# Patient Record
Sex: Male | Born: 1950 | Marital: Married | Smoking: Never smoker
Health system: Southern US, Community
[De-identification: ages and names within clinical notes are randomized; demographics above are authoritative.]

## PROBLEM LIST (undated history)

## (undated) DIAGNOSIS — N529 Male erectile dysfunction, unspecified: Secondary | ICD-10-CM

## (undated) DIAGNOSIS — B009 Herpesviral infection, unspecified: Secondary | ICD-10-CM

## (undated) DIAGNOSIS — F419 Anxiety disorder, unspecified: Secondary | ICD-10-CM

## (undated) DIAGNOSIS — F32A Depression, unspecified: Secondary | ICD-10-CM

## (undated) DIAGNOSIS — M1990 Unspecified osteoarthritis, unspecified site: Secondary | ICD-10-CM

## (undated) DIAGNOSIS — F329 Major depressive disorder, single episode, unspecified: Secondary | ICD-10-CM

## (undated) HISTORY — PX: WISDOM TOOTH EXTRACTION: SHX21

## (undated) HISTORY — PX: COLONOSCOPY: SHX174

## (undated) HISTORY — PX: EYE SURGERY: SHX253

## (undated) HISTORY — PX: KNEE SURGERY: SHX244

---
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
## 2000-10-31 ENCOUNTER — Ambulatory Visit (HOSPITAL_COMMUNITY): Admission: RE | Admit: 2000-10-31 | Discharge: 2000-10-31 | Payer: Self-pay | Admitting: Gastroenterology

## 2000-10-31 ENCOUNTER — Encounter (INDEPENDENT_AMBULATORY_CARE_PROVIDER_SITE_OTHER): Payer: Self-pay | Admitting: *Deleted

## 2001-11-20 ENCOUNTER — Observation Stay (HOSPITAL_COMMUNITY): Admission: EM | Admit: 2001-11-20 | Discharge: 2001-11-21 | Payer: Self-pay | Admitting: *Deleted

## 2002-02-19 ENCOUNTER — Encounter: Admission: RE | Admit: 2002-02-19 | Discharge: 2002-02-19 | Payer: Self-pay | Admitting: Family Medicine

## 2002-02-19 ENCOUNTER — Encounter: Payer: Self-pay | Admitting: Family Medicine

## 2003-05-17 ENCOUNTER — Emergency Department (HOSPITAL_COMMUNITY): Admission: EM | Admit: 2003-05-17 | Discharge: 2003-05-17 | Payer: Self-pay | Admitting: Emergency Medicine

## 2003-09-06 ENCOUNTER — Inpatient Hospital Stay (HOSPITAL_BASED_OUTPATIENT_CLINIC_OR_DEPARTMENT_OTHER): Admission: RE | Admit: 2003-09-06 | Discharge: 2003-09-06 | Payer: Self-pay | Admitting: *Deleted

## 2004-01-13 ENCOUNTER — Ambulatory Visit: Payer: Self-pay | Admitting: Cardiology

## 2004-05-03 IMAGING — CR DG CHEST 1V PORT
1 series · 1 of 1 positions shown · non-contrast
Comparison: none

CLINICAL DATA: Chest pain.
 PORTABLE CHEST, SINGLE VIEW 
 An AP semierect portable film of the chest dated 05/17/03 at 3328 hours is compared to a previous study of 11/20/01 at 6979 hours and shows little significant interval change.  The heart is within the limits of normal.  There mild generalized peribronchial thickening but no evidence of active infiltrate, edema, consolidation, pleural effusion or pneumothorax.  The bones are normal.

[view not recorded]
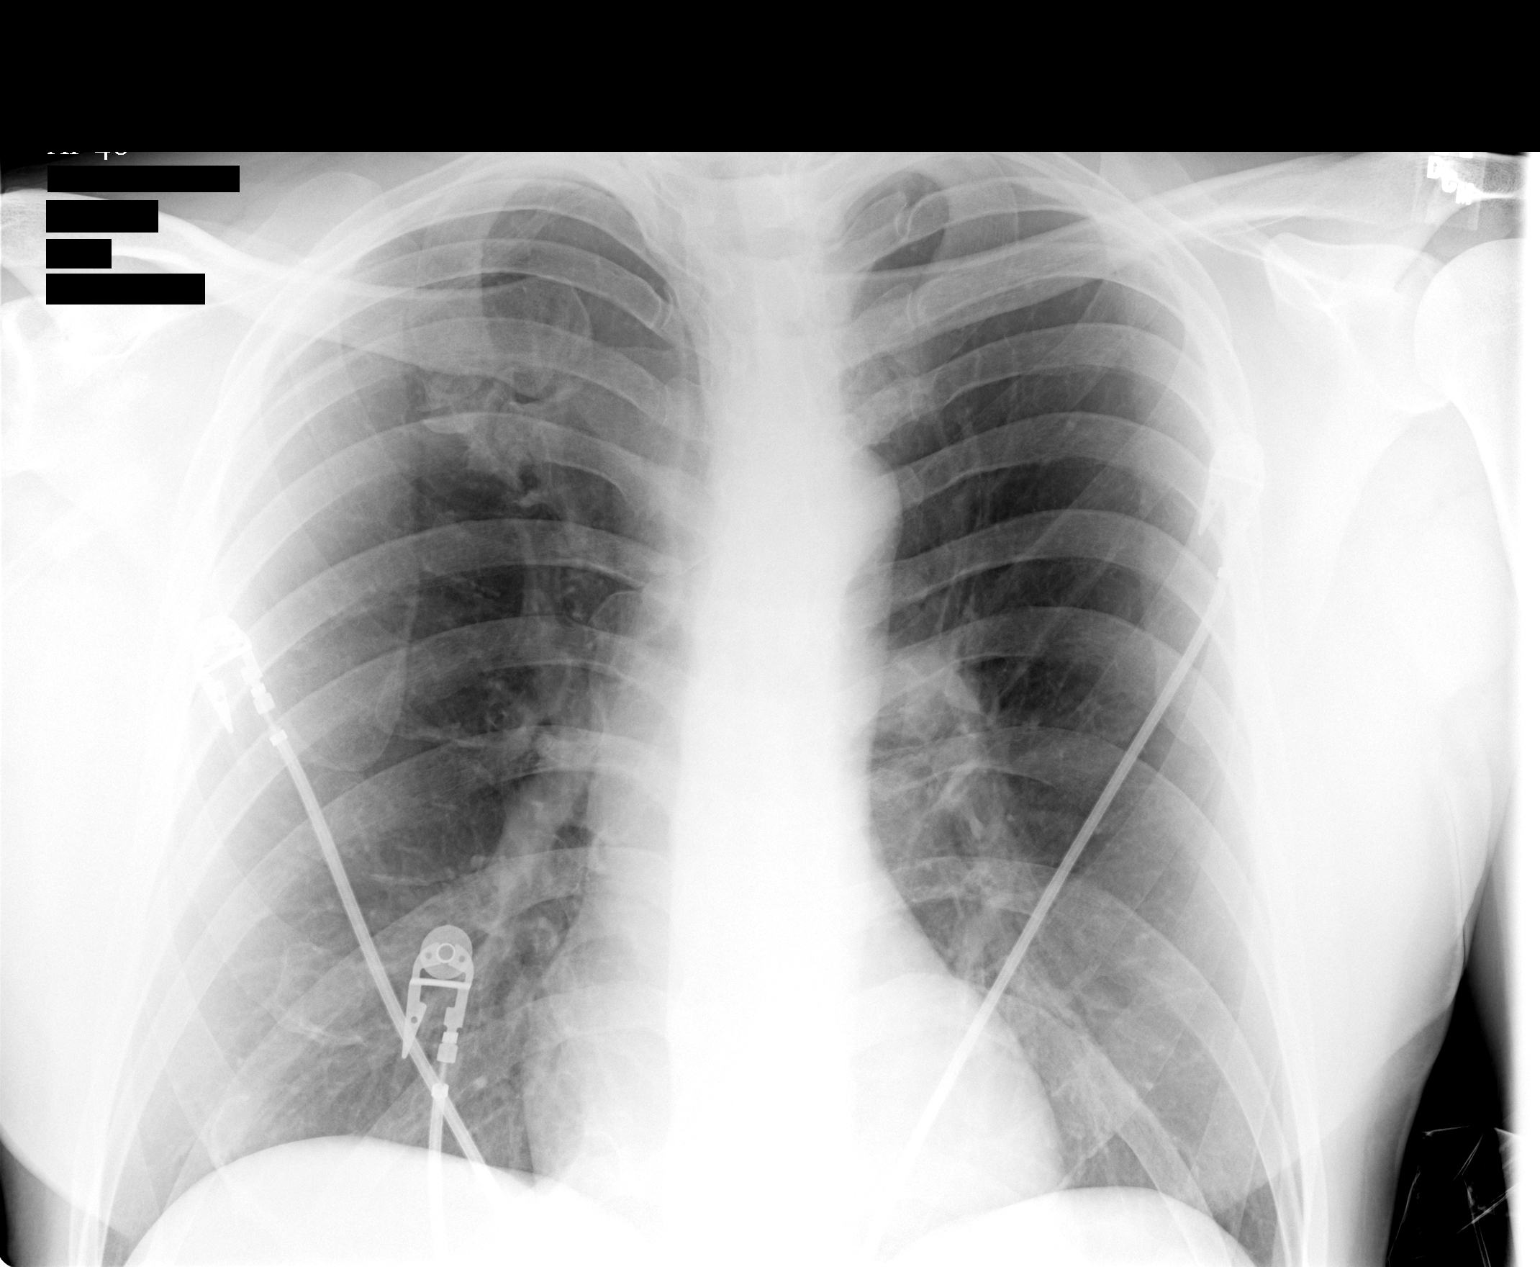

[1 of 1 positions shown; findings below may reference images not displayed]

IMPRESSION: No evidence of active disease in the chest.

## 2010-06-15 ENCOUNTER — Other Ambulatory Visit: Payer: Self-pay | Admitting: Gastroenterology

## 2010-08-03 NOTE — Procedures (Signed)
. [facility]  Patient:    Greg Munoz, Greg Munoz                         MRN: [id number] Proc. Date: 10/31/00 Adm. Date:  [id number] Attending:  Rich Brave CC:         Carolyne Fiscal, M.D.   Procedure Report  PROCEDURE:  Colonoscopy with polypectomy.  ENDOSCOPIST:  Florencia Reasons, M.D.  INDICATIONS:  A 60 year old with family history of colon cancer for screening. His most recent colonoscopy five years ago was negative except for two hyperplastic polyps.  FINDINGS:  Normal exam to the cecum except for two small polyps removed.  DESCRIPTION OF PROCEDURE:  The nature, purpose, and risks of the procedure were familiar to the patient from prior examination, and he provided written consent.  Sedation was fentanyl 100 mcg and Versed 10 mg IV without arrhythmias or desaturation.  Digital exam of the prostate was unremarkable.  The Olympus adult video colonoscope was advanced with just moderate difficulty through the sigmoid area, overcome by having the patient in the supine position.  Advancement was then easy to the cecum identified by visualization of the appendiceal orifice, and pullback was performed in the gradual fashion.  The quality of he prep was excellent, and it is felt that all areas were well seen.  There was a 5 mm sessile polyp just above the cecum removed by snare technique and suctioned through the scope.  There was a 3 mm sessile polyp at 60 cm removed by a single hot biopsy.  In each case, there was complete hemostasis and no evidence of excessive cautery.  The remainder of the colon was normal. No large polyps, cancer, colitis, vascular malformations, or diverticulosis were noted, and retroflexion in the rectum was unremarkable.  The patient tolerated well, and there were no apparent complication.  IMPRESSION:   Two colon polyps removed as described above.  PLAN:  Await pathology on the biopsies with further  colonoscopic surveillance in the future, the interval depending on the histologic findings. DD:  10/31/00 TD:  11/01/00 Job: [id number]

## 2010-08-03 NOTE — Cardiovascular Report (Signed)
NAME:  ASIM, GERSTEN NO.:  [id number]   MEDICAL RECORD NO.:  [id number]                   PATIENT TYPE:  OIB   LOCATION:  6501                                 FACILITY:  [facility]   PHYSICIAN:  Carole Binning, M.D. [facility]         DATE OF BIRTH:  December 20, 1950   DATE OF PROCEDURE:  09/06/2003  DATE OF DISCHARGE:  09/06/2003                              CARDIAC CATHETERIZATION   PROCEDURE PERFORMED:  Left heart catheterization with coronary angiography  and left ventriculography.   INDICATION:  Mr. Fludd is a 60 year old male who presented with symptoms of  chest pain.  A stress Cardiolite scan showed possible inferior ischemia.  He  was therefore referred for cardiac catheterization to rule out coronary  artery disease.   CATHETERIZATION PROCEDURAL NOTE:  A 4 French sheath was placed in the right  femoral artery.  Coronary angiography was performed with standard Judkins 4  French catheters.  Left ventriculography was performed with an angled  pigtail catheter.  Contrast was Omnipaque.  There were no complications.   CATHETERIZATION RESULTS:   HEMODYNAMICS:  1. Left ventricular pressure 128/20.  2. Aortic pressure 115/78.  3. There is no aortic valve gradient.   LEFT VENTRICULOGRAM:  Wall motion is normal.  Ejection fraction estimated at  greater than or equal to 60%.  There is no mitral regurgitation.   CORONARY ARTERIOGRAPHY (RIGHT DOMINANT):  Left main is normal.   Left anterior descending artery gives rise to a very large optional diagonal  branch which supplies the anterolateral wall.  There is a normal size second  diagonal branch also arising from the LAD.  The LAD is angiographically  normal.   Left circumflex gives rise to two normal to large size obtuse marginal  branches.  The left circumflex is normal.   Right coronary artery gives rise to a normal size posterior descending  artery and a small posterior lateral branch.  The right  coronary artery is  normal.   IMPRESSION:  1. Normal left ventricular systolic function.  2. Normal coronary arteries by angiography.   In summary, the patient's chest pain appears to be noncardiac.                                               Carole Binning, M.D. [facility]    MWP/MEDQ  D:  09/06/2003  T:  09/07/2003  Job:  [id number]   cc:   Mosetta Putt, M.D.  [address] [place]  [place] [address]  Fax: [phone]   Olga Millers, M.D. [facility]

## 2011-03-15 DIAGNOSIS — B009 Herpesviral infection, unspecified: Secondary | ICD-10-CM | POA: Insufficient documentation

## 2011-08-16 DIAGNOSIS — Z6827 Body mass index (BMI) 27.0-27.9, adult: Secondary | ICD-10-CM | POA: Insufficient documentation

## 2011-08-23 DIAGNOSIS — R030 Elevated blood-pressure reading, without diagnosis of hypertension: Secondary | ICD-10-CM | POA: Insufficient documentation

## 2012-11-23 DIAGNOSIS — G4700 Insomnia, unspecified: Secondary | ICD-10-CM | POA: Insufficient documentation

## 2013-11-24 DIAGNOSIS — Z0000 Encounter for general adult medical examination without abnormal findings: Secondary | ICD-10-CM | POA: Insufficient documentation

## 2015-10-20 DIAGNOSIS — G4730 Sleep apnea, unspecified: Secondary | ICD-10-CM | POA: Insufficient documentation

## 2016-01-31 ENCOUNTER — Other Ambulatory Visit (INDEPENDENT_AMBULATORY_CARE_PROVIDER_SITE_OTHER): Payer: Self-pay | Admitting: Orthopedic Surgery

## 2016-01-31 ENCOUNTER — Telehealth (INDEPENDENT_AMBULATORY_CARE_PROVIDER_SITE_OTHER): Payer: Self-pay

## 2016-01-31 MED ORDER — HYDROCODONE-ACETAMINOPHEN 5-325 MG PO TABS: ORAL_TABLET | ORAL | 0 refills | Status: DC

## 2016-01-31 NOTE — Telephone Encounter (Signed)
Patient called and states he needs a RF on hydrocodone. Please call back at [phone]

## 2016-01-31 NOTE — Telephone Encounter (Signed)
IC pt and LMVM that Rx ready at front deak for pickup

## 2016-01-31 NOTE — Telephone Encounter (Signed)
Please advise 

## 2016-01-31 NOTE — Telephone Encounter (Signed)
Ok to rf pls cla lthx

## 2016-02-29 ENCOUNTER — Telehealth (INDEPENDENT_AMBULATORY_CARE_PROVIDER_SITE_OTHER): Payer: Self-pay | Admitting: Orthopedic Surgery

## 2016-02-29 NOTE — Telephone Encounter (Signed)
Rx request hydrocodone, please advise

## 2016-03-01 ENCOUNTER — Telehealth (INDEPENDENT_AMBULATORY_CARE_PROVIDER_SITE_OTHER): Payer: Self-pay | Admitting: Orthopedic Surgery

## 2016-03-01 MED ORDER — HYDROCODONE-ACETAMINOPHEN 5-325 MG PO TABS: ORAL_TABLET | ORAL | 0 refills | Status: DC

## 2016-03-01 NOTE — Telephone Encounter (Signed)
IC pt and LMVM Rx ready to pickup at desk

## 2016-03-01 NOTE — Telephone Encounter (Signed)
Patient called asked concerning his Rx for (hydrocodone) The number to contact him is [phone]

## 2016-03-01 NOTE — Addendum Note (Signed)
Addended byCherre Huger: Brittania Sudbeck on: 03/01/2016 11:23 AM   Modules accepted: Orders

## 2016-03-01 NOTE — Telephone Encounter (Signed)
Ok to rf pls cla lthx

## 2016-03-01 NOTE — Telephone Encounter (Signed)
IC pt and LMVM that Rx is ready to pickup at the front desk

## 2016-04-05 ENCOUNTER — Other Ambulatory Visit (INDEPENDENT_AMBULATORY_CARE_PROVIDER_SITE_OTHER): Payer: Self-pay | Admitting: *Deleted

## 2016-04-05 MED ORDER — HYDROCODONE-ACETAMINOPHEN 5-325 MG PO TABS: ORAL_TABLET | ORAL | 0 refills | Status: DC

## 2016-04-05 NOTE — Telephone Encounter (Signed)
Pt called for refill hydrocodone

## 2016-04-05 NOTE — Telephone Encounter (Signed)
Can you call pt to advise Rx ready?  thanks

## 2016-04-05 NOTE — Telephone Encounter (Signed)
Rx request 

## 2016-04-05 NOTE — Telephone Encounter (Signed)
Rx approved per Dr August Saucerean, will call pt to pickup once signed.

## 2016-04-05 NOTE — Telephone Encounter (Signed)
I called patient to let him know that Rx is ready to pick up.  Office is closing at 5pm, but will reopen tomorrow from 9-3.

## 2016-04-17 ENCOUNTER — Encounter (INDEPENDENT_AMBULATORY_CARE_PROVIDER_SITE_OTHER): Payer: Self-pay | Admitting: Orthopedic Surgery

## 2016-04-17 ENCOUNTER — Ambulatory Visit (INDEPENDENT_AMBULATORY_CARE_PROVIDER_SITE_OTHER): Payer: BC Managed Care – PPO | Admitting: Orthopedic Surgery

## 2016-04-17 ENCOUNTER — Ambulatory Visit (INDEPENDENT_AMBULATORY_CARE_PROVIDER_SITE_OTHER): Payer: Self-pay

## 2016-04-17 DIAGNOSIS — M19011 Primary osteoarthritis, right shoulder: Secondary | ICD-10-CM

## 2016-04-17 MED ORDER — METHYLPREDNISOLONE ACETATE 40 MG/ML IJ SUSP
40.0000 mg | INTRAMUSCULAR | Status: AC | PRN
  Administered 2016-04-17: 40 mg via INTRA_ARTICULAR

## 2016-04-17 MED ORDER — LIDOCAINE HCL 1 % IJ SOLN
5.0000 mL | INTRAMUSCULAR | Status: AC | PRN
  Administered 2016-04-17: 5 mL

## 2016-04-17 MED ORDER — BUPIVACAINE HCL 0.5 % IJ SOLN
9.0000 mL | INTRAMUSCULAR | Status: AC | PRN
  Administered 2016-04-17: 9 mL via INTRA_ARTICULAR

## 2016-04-17 NOTE — Progress Notes (Signed)
   Procedure Note  Patient: Greg Munoz             Date of Birth: 09/28/1950           MRN: [id number]             Visit Date: 04/17/2016  Procedures: Visit Diagnoses: Primary osteoarthritis of right shoulder  Large Joint Inj Date/Time: 04/17/2016 1:02 PM Performed by: Cherre HugerMAY, WENDY Authorized by: Cammy CopaEAN, [name]   Consent Given by:  Patient Site marked: the procedure site was marked   Timeout: prior to procedure the correct patient, procedure, and site was verified   Indications:  Pain and diagnostic evaluation Location:  Shoulder Site:  R subacromial bursa Prep: patient was prepped and draped in usual sterile fashion   Needle Size:  18 G Needle Length:  1.5 inches Approach:  Posterior Ultrasound Guidance: No   Fluoroscopic Guidance: No   Arthrogram: No   Medications:  9 mL bupivacaine 0.5 %; 5 mL lidocaine 1 %; 40 mg methylPREDNISolone acetate 40 MG/ML Aspiration Attempted: No   Patient tolerance:  Patient tolerated the procedure well with no immediate complications

## 2016-05-07 ENCOUNTER — Telehealth (INDEPENDENT_AMBULATORY_CARE_PROVIDER_SITE_OTHER): Payer: Self-pay | Admitting: Orthopedic Surgery

## 2016-05-07 NOTE — Telephone Encounter (Signed)
Patient called needing Rx refilled (hydrocodone) The number to contact patient is [phone]

## 2016-05-07 NOTE — Telephone Encounter (Signed)
Rx request 

## 2016-05-08 MED ORDER — HYDROCODONE-ACETAMINOPHEN 5-325 MG PO TABS: ORAL_TABLET | ORAL | 0 refills | Status: DC

## 2016-05-08 NOTE — Addendum Note (Signed)
Addended byCherre Huger: Barnard Sharps on: 05/08/2016 05:44 PM   Modules accepted: Orders

## 2016-05-08 NOTE — Telephone Encounter (Signed)
IC patient and advised Rx ready for pickup. LMVM

## 2016-05-08 NOTE — Telephone Encounter (Signed)
Ok to rf pls cla lthx

## 2016-06-06 ENCOUNTER — Telehealth (INDEPENDENT_AMBULATORY_CARE_PROVIDER_SITE_OTHER): Payer: Self-pay | Admitting: Orthopedic Surgery

## 2016-06-06 NOTE — Telephone Encounter (Signed)
Patient called needing a refill on hydrocodone. CB # [phone]

## 2016-06-06 NOTE — Telephone Encounter (Signed)
Ok to refill 

## 2016-06-07 MED ORDER — HYDROCODONE-ACETAMINOPHEN 5-325 MG PO TABS
ORAL_TABLET | ORAL | 0 refills | Status: DC
Start: 2016-06-07 — End: 2016-07-08

## 2016-06-07 NOTE — Addendum Note (Signed)
Addended byPrescott Parma: Shivan Hodes on: 06/07/2016 12:08 PM   Modules accepted: Orders

## 2016-06-07 NOTE — Telephone Encounter (Signed)
rx can be picked up at front desk.

## 2016-06-07 NOTE — Telephone Encounter (Signed)
Ok refill? 

## 2016-06-07 NOTE — Telephone Encounter (Signed)
Advised patient ready for pick up  

## 2016-07-04 ENCOUNTER — Telehealth (INDEPENDENT_AMBULATORY_CARE_PROVIDER_SITE_OTHER): Payer: Self-pay | Admitting: *Deleted

## 2016-07-04 NOTE — Telephone Encounter (Signed)
Ok to rf? 

## 2016-07-04 NOTE — Telephone Encounter (Signed)
Patient called in this morning in regards to needing a prescription refill on his Hydrocodone please. His CB # [phone]. Thank you

## 2016-07-05 NOTE — Telephone Encounter (Signed)
Okay to refill but make it for 6 weeks instead of of 4 weeks thanks

## 2016-07-08 MED ORDER — HYDROCODONE-ACETAMINOPHEN 5-325 MG PO TABS: ORAL_TABLET | ORAL | 0 refills | Status: DC

## 2016-07-08 NOTE — Telephone Encounter (Signed)
rx done. Called patient and LM advising could pick up rx after 2pm this afternoon.

## 2016-09-05 ENCOUNTER — Telehealth (INDEPENDENT_AMBULATORY_CARE_PROVIDER_SITE_OTHER): Payer: Self-pay | Admitting: *Deleted

## 2016-09-05 NOTE — Telephone Encounter (Signed)
Pt calling for hydrocodone refill 

## 2016-09-05 NOTE — Telephone Encounter (Signed)
Ok to rf? 

## 2016-09-06 MED ORDER — HYDROCODONE-ACETAMINOPHEN 5-325 MG PO TABS: ORAL_TABLET | ORAL | 0 refills | Status: DC

## 2016-09-06 NOTE — Telephone Encounter (Signed)
Called and LM for patient advising could pick up after 2pm on Monday

## 2016-09-06 NOTE — Telephone Encounter (Signed)
y

## 2016-11-08 ENCOUNTER — Telehealth (INDEPENDENT_AMBULATORY_CARE_PROVIDER_SITE_OTHER): Payer: Self-pay | Admitting: Orthopedic Surgery

## 2016-11-08 NOTE — Telephone Encounter (Signed)
Patient called needing Rx refilled (Hydrocodone) The number to contact patient is [phone]

## 2016-11-11 NOTE — Telephone Encounter (Signed)
Please advise if ok to rf? 

## 2016-11-13 MED ORDER — HYDROCODONE-ACETAMINOPHEN 5-325 MG PO TABS
ORAL_TABLET | ORAL | 0 refills | Status: DC
Start: 2016-11-13 — End: 2017-01-10

## 2016-11-13 NOTE — Telephone Encounter (Signed)
When was last rf

## 2016-11-13 NOTE — Telephone Encounter (Signed)
Ok to rf pls cla lthx

## 2016-11-13 NOTE — Telephone Encounter (Signed)
0621

## 2016-11-13 NOTE — Addendum Note (Signed)
Addended byPrescott Parma: Diane Mochizuki on: 11/13/2016 12:36 PM   Modules accepted: Orders

## 2016-11-13 NOTE — Telephone Encounter (Signed)
Called advised could pick up at front desk. 

## 2017-01-09 ENCOUNTER — Telehealth (INDEPENDENT_AMBULATORY_CARE_PROVIDER_SITE_OTHER): Payer: Self-pay | Admitting: Orthopedic Surgery

## 2017-01-09 NOTE — Telephone Encounter (Signed)
Patient called asking for a refill on hydrocodone. CB # [phone]

## 2017-01-09 NOTE — Telephone Encounter (Signed)
Refill request, pls advise 

## 2017-01-10 ENCOUNTER — Other Ambulatory Visit (INDEPENDENT_AMBULATORY_CARE_PROVIDER_SITE_OTHER): Payer: Self-pay

## 2017-01-10 MED ORDER — HYDROCODONE-ACETAMINOPHEN 5-325 MG PO TABS: ORAL_TABLET | ORAL | 0 refills | Status: DC

## 2017-01-10 NOTE — Telephone Encounter (Signed)
Okay for 60 tablets 1 p.o. every 48 hours for 4 months please call thanks

## 2017-01-10 NOTE — Telephone Encounter (Signed)
IC LM could pick up at front desk.

## 2017-01-10 NOTE — Telephone Encounter (Signed)
Can you finish this one?  Get him to sign? thanks

## 2017-05-15 ENCOUNTER — Telehealth (INDEPENDENT_AMBULATORY_CARE_PROVIDER_SITE_OTHER): Payer: Self-pay | Admitting: Orthopedic Surgery

## 2017-05-15 MED ORDER — HYDROCODONE-ACETAMINOPHEN 5-325 MG PO TABS: ORAL_TABLET | ORAL | 0 refills | Status: DC

## 2017-05-15 NOTE — Telephone Encounter (Signed)
Ok to rf x 1 for 4 more mos then will need rov pls cla lhtx

## 2017-05-15 NOTE — Telephone Encounter (Signed)
Patient requesting RX refill on Hydrocodone. Please advise # [phone]

## 2017-05-15 NOTE — Telephone Encounter (Signed)
Ok to refill? Last filled 01/09/17 to last 4 months

## 2017-05-15 NOTE — Telephone Encounter (Signed)
IC advised could pick up at front desk. Dr August Saucerean changed rx to 1 po every other day must last 2 months #30

## 2017-07-15 ENCOUNTER — Telehealth (INDEPENDENT_AMBULATORY_CARE_PROVIDER_SITE_OTHER): Payer: Self-pay | Admitting: Orthopedic Surgery

## 2017-07-15 NOTE — Telephone Encounter (Signed)
Patient called requesting an RX refill on his Hydrocodone.  Patient uses [facility] on the [address] of [place] and [address].  CB#[phone].  Thank you.

## 2017-07-15 NOTE — Telephone Encounter (Signed)
Please advise if ok to rf? Last seen 03/2016

## 2017-07-15 NOTE — Telephone Encounter (Signed)
y

## 2017-07-16 MED ORDER — HYDROCODONE-ACETAMINOPHEN 5-325 MG PO TABS: ORAL_TABLET | ORAL | 0 refills | Status: DC

## 2017-07-16 NOTE — Telephone Encounter (Signed)
IC advised could pick up at front desk.  

## 2017-08-25 ENCOUNTER — Telehealth (INDEPENDENT_AMBULATORY_CARE_PROVIDER_SITE_OTHER): Payer: Self-pay | Admitting: Orthopedic Surgery

## 2017-08-25 NOTE — Telephone Encounter (Signed)
Patient called wanting to schedule an appointment to get an injection in both knees. CB # [phone]

## 2017-08-25 NOTE — Telephone Encounter (Signed)
IC s/w patient appt scheduled.  

## 2017-09-01 ENCOUNTER — Telehealth (INDEPENDENT_AMBULATORY_CARE_PROVIDER_SITE_OTHER): Payer: Self-pay

## 2017-09-01 ENCOUNTER — Encounter (INDEPENDENT_AMBULATORY_CARE_PROVIDER_SITE_OTHER): Payer: Self-pay | Admitting: Orthopedic Surgery

## 2017-09-01 ENCOUNTER — Ambulatory Visit (INDEPENDENT_AMBULATORY_CARE_PROVIDER_SITE_OTHER): Payer: BC Managed Care – PPO

## 2017-09-01 ENCOUNTER — Ambulatory Visit (INDEPENDENT_AMBULATORY_CARE_PROVIDER_SITE_OTHER): Payer: BC Managed Care – PPO | Admitting: Orthopedic Surgery

## 2017-09-01 DIAGNOSIS — M25562 Pain in left knee: Secondary | ICD-10-CM | POA: Diagnosis not present

## 2017-09-01 DIAGNOSIS — M1711 Unilateral primary osteoarthritis, right knee: Secondary | ICD-10-CM | POA: Diagnosis not present

## 2017-09-01 DIAGNOSIS — M25561 Pain in right knee: Secondary | ICD-10-CM

## 2017-09-01 DIAGNOSIS — M1712 Unilateral primary osteoarthritis, left knee: Secondary | ICD-10-CM

## 2017-09-01 NOTE — Telephone Encounter (Signed)
Noted  

## 2017-09-01 NOTE — Telephone Encounter (Signed)
Can we please get patient pre-approved for bilateral knee gel injections? Thanks.

## 2017-09-02 ENCOUNTER — Telehealth (INDEPENDENT_AMBULATORY_CARE_PROVIDER_SITE_OTHER): Payer: Self-pay

## 2017-09-02 NOTE — Telephone Encounter (Signed)
Submitted application online for SynviscOne, bilateral knee. 

## 2017-09-06 ENCOUNTER — Encounter (INDEPENDENT_AMBULATORY_CARE_PROVIDER_SITE_OTHER): Payer: Self-pay | Admitting: Orthopedic Surgery

## 2017-09-06 MED ORDER — BUPIVACAINE HCL 0.25 % IJ SOLN
4.0000 mL | INTRAMUSCULAR | Status: AC | PRN
  Administered 2017-09-06: 4 mL via INTRA_ARTICULAR

## 2017-09-06 MED ORDER — METHYLPREDNISOLONE ACETATE 40 MG/ML IJ SUSP
40.0000 mg | INTRAMUSCULAR | Status: AC | PRN
  Administered 2017-09-06: 40 mg via INTRA_ARTICULAR

## 2017-09-06 MED ORDER — LIDOCAINE HCL 1 % IJ SOLN
5.0000 mL | INTRAMUSCULAR | Status: AC | PRN
  Administered 2017-09-06: 5 mL

## 2017-09-06 MED ORDER — LIDOCAINE HCL 1 % IJ SOLN
5.0000 mL | INTRAMUSCULAR | Status: AC | PRN
Start: 2017-09-06 — End: 2017-09-06
  Administered 2017-09-06: 5 mL

## 2017-09-06 NOTE — Progress Notes (Signed)
Office Visit Note   Patient: Greg Munoz           Date of Birth: 04/24/1950           MRN: [id number] Visit Date: 09/01/2017 Requested by: Tracey HarriesBouska, David, MD [address] [place], [place] [address] PCP: Tracey HarriesBouska, David, MD  Subjective: Chief Complaint  Patient presents with  . Knee Pain    bilateral    HPI: Greg MaduroRobert is a patient with bilateral knee pain and arthritis.  He reports left greater than right knee pain along with swelling.  He describes constant pain in the knee.  He describes giving way occasionally.  He also describes pain at rest when he sitting in the car.  He has known history of shoulder arthritis.  He takes occasional Aleve and occasional hydrocodone.              ROS: All systems reviewed are negative as they relate to the chief complaint within the history of present illness.  Patient denies  fevers or chills.   Assessment & Plan: Visit Diagnoses:  1. Pain in both knees, unspecified chronicity   2. Unilateral primary osteoarthritis, left knee   3. Unilateral primary osteoarthritis, right knee     Plan: Impression is severe end-stage arthritis worse in the medial compartment both knees with significant varus alignment.  Plan is cortisone injections today.  We will preapproved him for a gel injection.  I will see him back as needed.  We can do the gel injection once his cortisone injections wear off  Follow-Up Instructions: Return if symptoms worsen or fail to improve.   Orders:  Orders Placed This Encounter  Procedures  . XR KNEE 3 VIEW RIGHT  . XR Knee 1-2 Views Left   No orders of the defined types were placed in this encounter.     Procedures: Large Joint Inj: bilateral knee on 09/06/2017 10:04 AM Indications: diagnostic evaluation, joint swelling and pain Details: 18 G 1.5 in needle, superolateral approach  Arthrogram: No  Medications (Right): 5 mL lidocaine 1 %; 4 mL bupivacaine 0.25 %; 40 mg methylPREDNISolone acetate 40  MG/ML Medications (Left): 5 mL lidocaine 1 %; 4 mL bupivacaine 0.25 %; 40 mg methylPREDNISolone acetate 40 MG/ML Outcome: tolerated well, no immediate complications Procedure, treatment alternatives, risks and benefits explained, specific risks discussed. Consent was given by the patient. Immediately prior to procedure a time out was called to verify the correct patient, procedure, equipment, support staff and site/side marked as required. Patient was prepped and draped in the usual sterile fashion.       Clinical Data: No additional findings.  Objective: Vital Signs: There were no vitals taken for this visit.  Physical Exam:   Constitutional: Patient appears well-developed HEENT:  Head: Normocephalic Eyes:EOM are normal Neck: Normal range of motion Cardiovascular: Normal rate Pulmonary/chest: Effort normal Neurologic: Patient is alert Skin: Skin is warm Psychiatric: Patient has normal mood and affect    Ortho Exam: Ortho exam demonstrates varus alignment bilateral lower extremities.  Pedal pulses palpable.  No groin pain with internal and external rotation of the leg.  He is developing about 5 degree flexion contractures in both knees.  Extensor mechanism is intact.  Collateral and cruciate ligaments are stable.  Range of motion is easily past 90 degrees.  This is in both knees.  Specialty Comments:  No specialty comments available.  Imaging: No results found.   PMFS History: There are no active problems to display for  this patient.  History reviewed. No pertinent past medical history.  History reviewed. No pertinent family history.  History reviewed. No pertinent surgical history. Social History   Occupational History  . Not on file  Tobacco Use  . Smoking status: Never Smoker  . Smokeless tobacco: Never Used  Substance and Sexual Activity  . Alcohol use: Not on file  . Drug use: Not on file  . Sexual activity: Not on file

## 2017-09-15 ENCOUNTER — Telehealth (INDEPENDENT_AMBULATORY_CARE_PROVIDER_SITE_OTHER): Payer: Self-pay

## 2017-09-15 ENCOUNTER — Telehealth (INDEPENDENT_AMBULATORY_CARE_PROVIDER_SITE_OTHER): Payer: Self-pay | Admitting: Orthopedic Surgery

## 2017-09-15 MED ORDER — HYDROCODONE-ACETAMINOPHEN 5-325 MG PO TABS: ORAL_TABLET | ORAL | 0 refills | Status: DC

## 2017-09-15 NOTE — Telephone Encounter (Signed)
Ok to refill?  #30 on 07/16/17  To take every other day prn pain must last 2 months

## 2017-09-15 NOTE — Telephone Encounter (Signed)
Talked with patient and advised him that is approvbed for SynviscOne, bilateral knee. Buy & Bill Covered at 100% after $80.00 co-pay  [place] Approved Reference#-[id number] Valid 09/12/2017- 09/12/2018 Appt. 09/24/17

## 2017-09-15 NOTE — Telephone Encounter (Signed)
Patient requesting rx refill on hydrocodone. Patients # [phone]

## 2017-09-15 NOTE — Telephone Encounter (Signed)
y

## 2017-09-15 NOTE — Telephone Encounter (Signed)
PA form faxed for SynviscOne, bilateral knee to [phone] or [phone] per Toniann FailWendy M.on 09/12/17.

## 2017-09-15 NOTE — Telephone Encounter (Signed)
IC advised could pick up at front desk.  

## 2017-09-24 ENCOUNTER — Encounter (INDEPENDENT_AMBULATORY_CARE_PROVIDER_SITE_OTHER): Payer: Self-pay | Admitting: Orthopedic Surgery

## 2017-09-24 ENCOUNTER — Ambulatory Visit (INDEPENDENT_AMBULATORY_CARE_PROVIDER_SITE_OTHER): Payer: BC Managed Care – PPO | Admitting: Orthopedic Surgery

## 2017-09-24 DIAGNOSIS — M1712 Unilateral primary osteoarthritis, left knee: Secondary | ICD-10-CM

## 2017-09-24 DIAGNOSIS — M1711 Unilateral primary osteoarthritis, right knee: Secondary | ICD-10-CM

## 2017-09-28 ENCOUNTER — Encounter (INDEPENDENT_AMBULATORY_CARE_PROVIDER_SITE_OTHER): Payer: Self-pay | Admitting: Orthopedic Surgery

## 2017-09-28 DIAGNOSIS — M1712 Unilateral primary osteoarthritis, left knee: Secondary | ICD-10-CM

## 2017-09-28 DIAGNOSIS — M1711 Unilateral primary osteoarthritis, right knee: Secondary | ICD-10-CM

## 2017-09-28 MED ORDER — LIDOCAINE HCL 1 % IJ SOLN
5.0000 mL | INTRAMUSCULAR | Status: AC | PRN
  Administered 2017-09-28: 5 mL

## 2017-09-28 MED ORDER — HYLAN G-F 20 48 MG/6ML IX SOSY
48.0000 mg | PREFILLED_SYRINGE | INTRA_ARTICULAR | Status: AC | PRN
  Administered 2017-09-28: 48 mg via INTRA_ARTICULAR

## 2017-09-28 MED ORDER — HYLAN G-F 20 48 MG/6ML IX SOSY
48.0000 mg | PREFILLED_SYRINGE | INTRA_ARTICULAR | Status: AC | PRN
Start: 2017-09-28 — End: 2017-09-28
  Administered 2017-09-28: 48 mg via INTRA_ARTICULAR

## 2017-09-28 NOTE — Progress Notes (Signed)
   Procedure Note  Patient: Wells GuilesRobert I Zuver             Date of Birth: 10/19/1950           MRN: [id number]             Visit Date: 09/24/2017  Procedures: Visit Diagnoses: Unilateral primary osteoarthritis, left knee  Unilateral primary osteoarthritis, right knee  Large Joint Inj: bilateral knee on 09/28/2017 10:21 PM Indications: diagnostic evaluation, joint swelling and pain Details: 18 G 1.5 in needle, superolateral approach  Arthrogram: No  Medications (Right): 5 mL lidocaine 1 %; 48 mg Hylan 48 MG/6ML Medications (Left): 5 mL lidocaine 1 %; 48 mg Hylan 48 MG/6ML Outcome: tolerated well, no immediate complications Procedure, treatment alternatives, risks and benefits explained, specific risks discussed. Consent was given by the patient. Immediately prior to procedure a time out was called to verify the correct patient, procedure, equipment, support staff and site/side marked as required. Patient was prepped and draped in the usual sterile fashion.

## 2017-09-30 ENCOUNTER — Telehealth (INDEPENDENT_AMBULATORY_CARE_PROVIDER_SITE_OTHER): Payer: Self-pay | Admitting: Orthopedic Surgery

## 2017-09-30 NOTE — Telephone Encounter (Signed)
Patient called stating that he received injections in both of his knees but now the left is bothering and is hurting.  He will be doing a lot of walking tomorrow and wanted to know if there was anything that he could do for it.  CB#[phone].  Thank you.

## 2017-09-30 NOTE — Telephone Encounter (Signed)
Please advise. Thanks.  

## 2017-09-30 NOTE — Telephone Encounter (Signed)
Ice the knee after the activity.  Take an anti-inflammatory before the activity.  Consider using a knee brace or knee wrap during walking.  Avoid prolonged walking.  He is coming close to being at the end of his rope as far as that left knee goes

## 2017-09-30 NOTE — Telephone Encounter (Signed)
IC S/W PATIENT AND ADVISED PER DR August SaucerEAN. HE STATED HE APPRECIATED THE CALL BUT DID NOT FEEL THAT THE SUGGESTION WOULD BE BENEFICIAL FOR HIM

## 2017-11-11 ENCOUNTER — Telehealth (INDEPENDENT_AMBULATORY_CARE_PROVIDER_SITE_OTHER): Payer: Self-pay | Admitting: Orthopedic Surgery

## 2017-11-11 NOTE — Telephone Encounter (Signed)
Patient called to get refill for Hydrocodone.  \Please call patient to advise [phone]

## 2017-11-12 MED ORDER — HYDROCODONE-ACETAMINOPHEN 5-325 MG PO TABS: ORAL_TABLET | ORAL | 0 refills | Status: DC

## 2017-11-12 NOTE — Telephone Encounter (Signed)
Not my pt thanks

## 2017-11-12 NOTE — Telephone Encounter (Signed)
Ok pls call thx

## 2017-11-12 NOTE — Telephone Encounter (Signed)
I called patient but he did not answer. Rx will be ready at front desk for him to pick up on Friday.  It will not be available for pick up until then.

## 2017-11-12 NOTE — Telephone Encounter (Signed)
Please advise 

## 2017-12-10 ENCOUNTER — Ambulatory Visit (INDEPENDENT_AMBULATORY_CARE_PROVIDER_SITE_OTHER): Payer: BC Managed Care – PPO | Admitting: Orthopedic Surgery

## 2017-12-19 ENCOUNTER — Ambulatory Visit (INDEPENDENT_AMBULATORY_CARE_PROVIDER_SITE_OTHER): Payer: BC Managed Care – PPO | Admitting: Orthopedic Surgery

## 2017-12-19 ENCOUNTER — Encounter (INDEPENDENT_AMBULATORY_CARE_PROVIDER_SITE_OTHER): Payer: Self-pay | Admitting: Orthopedic Surgery

## 2017-12-19 DIAGNOSIS — M1712 Unilateral primary osteoarthritis, left knee: Secondary | ICD-10-CM | POA: Diagnosis not present

## 2017-12-19 DIAGNOSIS — M1711 Unilateral primary osteoarthritis, right knee: Secondary | ICD-10-CM | POA: Diagnosis not present

## 2017-12-19 MED ORDER — METHYLPREDNISOLONE ACETATE 40 MG/ML IJ SUSP
40.0000 mg | INTRAMUSCULAR | Status: AC | PRN
  Administered 2017-12-19: 40 mg via INTRA_ARTICULAR

## 2017-12-19 MED ORDER — LIDOCAINE HCL 1 % IJ SOLN
5.0000 mL | INTRAMUSCULAR | Status: AC | PRN
  Administered 2017-12-19: 5 mL

## 2017-12-19 MED ORDER — BUPIVACAINE HCL 0.25 % IJ SOLN
4.0000 mL | INTRAMUSCULAR | Status: AC | PRN
  Administered 2017-12-19: 4 mL via INTRA_ARTICULAR

## 2017-12-19 NOTE — Progress Notes (Signed)
Office Visit Note   Patient: Greg Munoz           Date of Birth: 1950/07/23           MRN: [id number] Visit Date: 12/19/2017 Requested by: Tracey Harries, MD [address] [place], [place] [address] PCP: Tracey Harries, MD  Subjective: Chief Complaint  Patient presents with  . Right Knee - Pain  . Left Knee - Pain    HPI: Greg Munoz is a 67-year-old patient with bilateral knee pain.  Has bilateral end-stage knee arthritis worse medial side.  Is had previous injections with more relief from cortisone and gel injection.  Left knee hurts more than the right.  He is tried Aleve and Tylenol without much relief.  Does take occasional hydrocodone.              ROS: All systems reviewed are negative as they relate to the chief complaint within the history of present illness.  Patient denies  fevers or chills.   Assessment & Plan: Visit Diagnoses:  1. Unilateral primary osteoarthritis, left knee   2. Unilateral primary osteoarthritis, right knee     Plan: Impression is bilateral knee pain and arthritis.  Bilateral knee cortisone injections performed today.  We discussed non-loadbearing quad strengthening exercises.  He is likely heading for knee replacement.  I think he may be a reasonable candidate for press-fit knee replacement based on how his bone quality looks.  I will see him back as needed  Follow-Up Instructions: Return if symptoms worsen or fail to improve.   Orders:  No orders of the defined types were placed in this encounter.  No orders of the defined types were placed in this encounter.     Procedures: Large Joint Inj: bilateral knee on 12/19/2017 3:42 PM Indications: diagnostic evaluation, joint swelling and pain Details: 18 G 1.5 in needle, superolateral approach  Arthrogram: No  Medications (Right): 5 mL lidocaine 1 %; 4 mL bupivacaine 0.25 %; 40 mg methylPREDNISolone acetate 40 MG/ML Medications (Left): 5 mL lidocaine 1 %; 4 mL bupivacaine 0.25 %; 40 mg  methylPREDNISolone acetate 40 MG/ML Outcome: tolerated well, no immediate complications Procedure, treatment alternatives, risks and benefits explained, specific risks discussed. Consent was given by the patient. Immediately prior to procedure a time out was called to verify the correct patient, procedure, equipment, support staff and site/side marked as required. Patient was prepped and draped in the usual sterile fashion.       Clinical Data: No additional findings.  Objective: Vital Signs: There were no vitals taken for this visit.  Physical Exam:   Constitutional: Patient appears well-developed HEENT:  Head: Normocephalic Eyes:EOM are normal Neck: Normal range of motion Cardiovascular: Normal rate Pulmonary/chest: Effort normal Neurologic: Patient is alert Skin: Skin is warm Psychiatric: Patient has normal mood and affect    Ortho Exam: Ortho exam demonstrates full active and passive range of motion of the ankles with reasonable range of motion of the hips.  Knees have 5 to 10 degree flexion contractures bilaterally with primarily medial greater than lateral pain.  He does have varus alignment.  Has mild effusion in both knees.  Extensor mechanism is intact.  Specialty Comments:  No specialty comments available.  Imaging: No results found.   PMFS History: There are no active problems to display for this patient.  History reviewed. No pertinent past medical history.  History reviewed. No pertinent family history.  History reviewed. No pertinent surgical history. Social History  Occupational History  . Not on file  Tobacco Use  . Smoking status: Never Smoker  . Smokeless tobacco: Never Used  Substance and Sexual Activity  . Alcohol use: Not on file  . Drug use: Not on file  . Sexual activity: Not on file

## 2018-01-12 ENCOUNTER — Ambulatory Visit (INDEPENDENT_AMBULATORY_CARE_PROVIDER_SITE_OTHER): Payer: BC Managed Care – PPO | Admitting: Orthopedic Surgery

## 2018-01-12 ENCOUNTER — Encounter (INDEPENDENT_AMBULATORY_CARE_PROVIDER_SITE_OTHER): Payer: Self-pay | Admitting: Orthopedic Surgery

## 2018-01-12 DIAGNOSIS — M19011 Primary osteoarthritis, right shoulder: Secondary | ICD-10-CM | POA: Diagnosis not present

## 2018-01-12 MED ORDER — HYDROCODONE-ACETAMINOPHEN 5-325 MG PO TABS: ORAL_TABLET | ORAL | 0 refills | Status: DC

## 2018-01-12 MED ORDER — BUPIVACAINE HCL 0.5 % IJ SOLN
9.0000 mL | INTRAMUSCULAR | Status: AC | PRN
  Administered 2018-01-12: 9 mL via INTRA_ARTICULAR

## 2018-01-12 MED ORDER — METHYLPREDNISOLONE ACETATE 40 MG/ML IJ SUSP
40.0000 mg | INTRAMUSCULAR | Status: AC | PRN
  Administered 2018-01-12: 40 mg via INTRA_ARTICULAR

## 2018-01-12 MED ORDER — LIDOCAINE HCL 1 % IJ SOLN
5.0000 mL | INTRAMUSCULAR | Status: AC | PRN
  Administered 2018-01-12: 5 mL

## 2018-01-12 NOTE — Progress Notes (Signed)
Office Visit Note   Patient: Greg Munoz           Date of Birth: 1950/03/30           MRN: [id number] Visit Date: 01/12/2018 Requested by: Tracey Harries, MD [address] [place], [place] [address] PCP: Tracey Harries, MD  Subjective: Chief Complaint  Patient presents with  . Right Shoulder - Pain    HPI: Eual is a patient with right shoulder pain.  Has known history of right shoulder arthritis.  Previous right shoulder injection in January 2018.  He is having a hard time working the mouse.  Recently had bilateral knee injections which helped.  He is 67 years old.  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              ROS: All systems reviewed are negative as they relate to the chief complaint within the history of present illness.  Patient denies  fevers or chills.   Assessment & Plan: Visit Diagnoses:  1. Primary osteoarthritis of right shoulder     Plan: Impression is right shoulder arthritis which is worsening.  He is in the gray zone for anatomic shoulder replacement versus reverse shoulder replacement.  I think he has significant B2 deformity.  He would need posterior augmentation with either glenoid baseplate modular from [employer] or reverse shoulder replacement augmented baseplate from [employer].  We injected the shoulder today.  I will see him back as needed.  Follow-Up Instructions: Return if symptoms worsen or fail to improve.   Orders:  No orders of the defined types were placed in this encounter.  Meds ordered this encounter  Medications  . HYDROcodone-acetaminophen (NORCO/VICODIN) 5-325 MG tablet    Sig: Take 1 tablet every other day as needed for pain. Must last 2 months    Dispense:  30 tablet    Refill:  0    Cannot fill until 01/14/18      Procedures: Large Joint Inj: R glenohumeral on 01/12/2018 8:57 PM Indications: diagnostic evaluation and pain Details: 18 G 1.5 in needle, posterior approach  Arthrogram:  No  Medications: 9 mL bupivacaine 0.5 %; 40 mg methylPREDNISolone acetate 40 MG/ML; 5 mL lidocaine 1 % Outcome: tolerated well, no immediate complications Procedure, treatment alternatives, risks and benefits explained, specific risks discussed. Consent was given by the patient. Immediately prior to procedure a time out was called to verify the correct patient, procedure, equipment, support staff and site/side marked as required. Patient was prepped and draped in the usual sterile fashion.       Clinical Data: No additional findings.  Objective: Vital Signs: There were no vitals taken for this visit.  Physical Exam:   Constitutional: Patient appears well-developed HEENT:  Head: Normocephalic Eyes:EOM are normal Neck: Normal range of motion Cardiovascular: Normal rate Pulmonary/chest: Effort normal Neurologic: Patient is alert Skin: Skin is warm Psychiatric: Patient has normal mood and affect    Ortho Exam: Ortho exam demonstrates full active and passive range of motion of the ankles and hips.  Knees have varus deformity but otherwise unchanged in exam.  The right shoulder has diminished and painful range of motion particularly with external rotation.  Forward flexion and abduction both around 90.  Cuff strength is intact infraspinatus supraspinatus and subscap muscle testing.  There is bone-on-bone type coarseness and grinding with shoulder range of motion.  This is primarily affecting the right shoulder and to a lesser degree  the left shoulder but with maintained range of motion in a wider arc.  Specialty Comments:  No specialty comments available.  Imaging: No results found.   PMFS History: There are no active problems to display for this patient.  History reviewed. No pertinent past medical history.  History reviewed. No pertinent family history.  History reviewed. No pertinent surgical history. Social History   Occupational History  . Not on file  Tobacco Use  .  Smoking status: Never Smoker  . Smokeless tobacco: Never Used  Substance and Sexual Activity  . Alcohol use: Not on file  . Drug use: Not on file  . Sexual activity: Not on file

## 2018-03-09 ENCOUNTER — Encounter (INDEPENDENT_AMBULATORY_CARE_PROVIDER_SITE_OTHER): Payer: Self-pay | Admitting: Family Medicine

## 2018-03-09 ENCOUNTER — Telehealth (INDEPENDENT_AMBULATORY_CARE_PROVIDER_SITE_OTHER): Payer: Self-pay | Admitting: Family Medicine

## 2018-03-09 ENCOUNTER — Ambulatory Visit (INDEPENDENT_AMBULATORY_CARE_PROVIDER_SITE_OTHER): Payer: BC Managed Care – PPO | Admitting: Family Medicine

## 2018-03-09 DIAGNOSIS — M1712 Unilateral primary osteoarthritis, left knee: Secondary | ICD-10-CM | POA: Diagnosis not present

## 2018-03-09 DIAGNOSIS — M25561 Pain in right knee: Secondary | ICD-10-CM

## 2018-03-09 MED ORDER — LIDOCAINE HCL 1 % IJ SOLN
3.0000 mL | INTRAMUSCULAR | Status: AC | PRN
  Administered 2018-03-09: 3 mL

## 2018-03-09 MED ORDER — METHYLPREDNISOLONE ACETATE 40 MG/ML IJ SUSP
40.0000 mg | INTRAMUSCULAR | Status: AC | PRN
  Administered 2018-03-09: 40 mg via INTRA_ARTICULAR

## 2018-03-09 NOTE — Telephone Encounter (Signed)
Please attempt to get insurance approval for bilateral knee gel injections for OA.

## 2018-03-09 NOTE — Progress Notes (Signed)
   Office Visit Note   Patient: Greg Munoz           Date of Birth: 02/05/1951           MRN: [id number] Visit Date: 03/09/2018 Requested by: Tracey HarriesBouska, David, MD [address] [place], [place] [id number] PCP: Tracey HarriesBouska, David, MD  Subjective: Chief Complaint  Patient presents with  . Right Knee - Pain    Requesting cortisone injection bilateral knees - last injected by Dr. August Saucerean 12/19/17. It lasted 6 weeks.  . Left Knee - Pain  . Right Shoulder - Pain    HPI: 67 year old with bilateral knee pain.  History of end-stage medial compartment DJD in both knees.  Last injections almost 3 months ago helped until a couple weeks ago.  He would like to try them again.  He is trying to delay surgery for a while.              ROS: Noncontributory  Objective: Vital Signs: There were no vitals taken for this visit.  Physical Exam:  Knees: 1+ effusion in both knees with no warmth or erythema.  Varus deformity, slight laxity with valgus stress but solid endpoint.  Tender on the medial joint lines.  Imaging: None today.  Assessment & Plan: 1.  Bilateral knee osteoarthritis, mainly medial compartment -Steroid injections today.  We will attempt to get approval for gel injections for next time.   Follow-Up Instructions: No follow-ups on file.      Procedures: Large Joint Inj on 03/09/2018 3:06 PM Details: 25 G 1.5 in needle  Arthrogram: No  Medications: 3 mL lidocaine 1 %; 40 mg methylPREDNISolone acetate 40 MG/ML Aspirate: clear and yellow  Left knee injection from superomedial approach and right knee from superolateral approach.  A flash of synovial fluid was obtained prior to each injection. Consent was given by the patient.      No notes on file    PMFS History: Patient Active Problem List   Diagnosis Date Noted  . Sleep apnea in adult 10/20/2015  . Annual physical exam 11/24/2013  . Persistent insomnia 11/23/2012  . Elevated blood pressure reading  08/23/2011  . Body mass index 27.0-27.9, adult 08/16/2011  . Herpes simplex 03/15/2011  . Disorder of external ear 11/27/2009  . 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 No pertinent past medical history.  History reviewed. No pertinent family history.  History reviewed. No pertinent surgical history. Social History   Occupational History  . Not on file  Tobacco Use  . Smoking status: Never Smoker  . Smokeless tobacco: Never Used  Substance and Sexual Activity  . Alcohol use: Not on file  . Drug use: Not on file  . Sexual activity: Not on file

## 2018-03-16 ENCOUNTER — Other Ambulatory Visit (INDEPENDENT_AMBULATORY_CARE_PROVIDER_SITE_OTHER): Payer: Self-pay | Admitting: Family Medicine

## 2018-03-16 ENCOUNTER — Telehealth (INDEPENDENT_AMBULATORY_CARE_PROVIDER_SITE_OTHER): Payer: Self-pay | Admitting: Orthopedic Surgery

## 2018-03-16 MED ORDER — HYDROCODONE-ACETAMINOPHEN 5-325 MG PO TABS: ORAL_TABLET | ORAL | 0 refills | Status: DC

## 2018-03-16 NOTE — Telephone Encounter (Signed)
Patient called request an RX refill on his Hydrocodone.  CB#[phone]

## 2018-03-16 NOTE — Telephone Encounter (Signed)
Please advise on the refill. You saw him 03/09/18 for knee pain and he received a cortisone injection in the right knee.

## 2018-03-16 NOTE — Telephone Encounter (Signed)
Can you see if Dr. Prince RomeHilts will advise since Dr. August Saucerean is out of the office?

## 2018-03-16 NOTE — Telephone Encounter (Signed)
Advised the patient this was refilled and sent electronically to his pharmacy.

## 2018-03-16 NOTE — Telephone Encounter (Signed)
Rx sent 

## 2018-03-16 NOTE — Telephone Encounter (Signed)
Noted  

## 2018-04-02 ENCOUNTER — Telehealth (INDEPENDENT_AMBULATORY_CARE_PROVIDER_SITE_OTHER): Payer: Self-pay

## 2018-04-02 NOTE — Telephone Encounter (Signed)
Left VM for patient to [facility] concerning insurance information.

## 2018-06-01 ENCOUNTER — Encounter (INDEPENDENT_AMBULATORY_CARE_PROVIDER_SITE_OTHER): Payer: Self-pay | Admitting: Orthopedic Surgery

## 2018-06-01 ENCOUNTER — Ambulatory Visit (INDEPENDENT_AMBULATORY_CARE_PROVIDER_SITE_OTHER): Payer: BC Managed Care – PPO | Admitting: Orthopedic Surgery

## 2018-06-01 ENCOUNTER — Other Ambulatory Visit: Payer: Self-pay

## 2018-06-01 DIAGNOSIS — M19011 Primary osteoarthritis, right shoulder: Secondary | ICD-10-CM

## 2018-06-03 ENCOUNTER — Encounter (INDEPENDENT_AMBULATORY_CARE_PROVIDER_SITE_OTHER): Payer: Self-pay | Admitting: Orthopedic Surgery

## 2018-06-03 DIAGNOSIS — M19011 Primary osteoarthritis, right shoulder: Secondary | ICD-10-CM

## 2018-06-03 MED ORDER — LIDOCAINE HCL 1 % IJ SOLN
5.0000 mL | INTRAMUSCULAR | Status: AC | PRN
  Administered 2018-06-03: 5 mL

## 2018-06-03 MED ORDER — BUPIVACAINE HCL 0.5 % IJ SOLN
9.0000 mL | INTRAMUSCULAR | Status: AC | PRN
  Administered 2018-06-03: 9 mL via INTRA_ARTICULAR

## 2018-06-03 MED ORDER — METHYLPREDNISOLONE ACETATE 40 MG/ML IJ SUSP
40.0000 mg | INTRAMUSCULAR | Status: AC | PRN
  Administered 2018-06-03: 40 mg via INTRA_ARTICULAR

## 2018-06-03 NOTE — Progress Notes (Signed)
Office Visit Note   Patient: Greg Munoz           Date of Birth: Aug 06, 1950           MRN: [id number] Visit Date: 06/01/2018 Requested by: Tracey Harries, MD [address] [place], [place] [address] PCP: Tracey Harries, MD  Subjective: Chief Complaint  Patient presents with  . Right Shoulder - Follow-up, Pain    HPI: Greg Munoz is a patient with known right shoulder arthritis.  He has had injections in the past about 2/year.  They do help his shoulder pain.  He also has bilateral knee arthritis.  Last injection was in October 2019.  He has been doing physical therapy with dry needling and exercises which is helped some.              ROS: All systems reviewed are negative as they relate to the chief complaint within the history of present illness.  Patient denies  fevers or chills.   Assessment & Plan: Visit Diagnoses:  1. Primary osteoarthritis of right shoulder     Plan: Impression is right shoulder pain arthritis which is worsening.  Plan shoulder injection today with glenohumeral joint injection.  See if that helps him.  We discussed little bit today about shoulder replacement the pros and cons and rehab.  Longevity also discussed.  I will see him back as needed  Follow-Up Instructions: Return if symptoms worsen or fail to improve.   Orders:  No orders of the defined types were placed in this encounter.  No orders of the defined types were placed in this encounter.     Procedures: Large Joint Inj: R glenohumeral on 06/03/2018 4:07 PM Indications: diagnostic evaluation and pain Details: 18 G 1.5 in needle, posterior approach  Arthrogram: No  Medications: 9 mL bupivacaine 0.5 %; 40 mg methylPREDNISolone acetate 40 MG/ML; 5 mL lidocaine 1 % Outcome: tolerated well, no immediate complications Procedure, treatment alternatives, risks and benefits explained, specific risks discussed. Consent was given by the patient. Immediately prior to procedure a time out  was called to verify the correct patient, procedure, equipment, support staff and site/side marked as required. Patient was prepped and draped in the usual sterile fashion.       Clinical Data: No additional findings.  Objective: Vital Signs: There were no vitals taken for this visit.  Physical Exam:   Constitutional: Patient appears well-developed HEENT:  Head: Normocephalic Eyes:EOM are normal Neck: Normal range of motion Cardiovascular: Normal rate Pulmonary/chest: Effort normal Neurologic: Patient is alert Skin: Skin is warm Psychiatric: Patient has normal mood and affect    Ortho Exam: Orthopedic exam demonstrates good shoulder rotator cuff strength on the right-hand side infraspinatus supraspinatus and subscap muscle testing.  Patient has good cervical spine range of motion.  External rotation on the right at 15 degrees of abduction is about 20 degrees.  Forward flexion abduction approaching 90 degrees of abduction and has about 100 of forward flexion.  Coarse grinding is present with passive range of motion consistent with known diagnosis of glenohumeral arthritis.  Specialty Comments:  No specialty comments available.  Imaging: No results found.   PMFS History: Patient Active Problem List   Diagnosis Date Noted  . Sleep apnea in adult 10/20/2015  . Annual physical exam 11/24/2013  . Persistent insomnia 11/23/2012  . Elevated blood pressure reading 08/23/2011  . Body mass index 27.0-27.9, adult 08/16/2011  . Herpes simplex 03/15/2011  . Disorder of external ear 11/27/2009  .  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 No pertinent past medical history.  History reviewed. No pertinent family history.  History reviewed. No pertinent surgical history. Social History   Occupational History  . Not on file  Tobacco Use  . Smoking status: Never Smoker  . Smokeless tobacco: Never Used  Substance and Sexual Activity  . Alcohol  use: Not on file  . Drug use: Not on file  . Sexual activity: Not on file

## 2018-07-15 ENCOUNTER — Telehealth (INDEPENDENT_AMBULATORY_CARE_PROVIDER_SITE_OTHER): Payer: Self-pay | Admitting: Orthopedic Surgery

## 2018-07-15 NOTE — Telephone Encounter (Signed)
Ok to rf? 

## 2018-07-15 NOTE — Telephone Encounter (Signed)
Rx refill Hydrocodone °

## 2018-07-16 MED ORDER — HYDROCODONE-ACETAMINOPHEN 5-325 MG PO TABS: ORAL_TABLET | ORAL | 0 refills | Status: DC

## 2018-07-16 NOTE — Telephone Encounter (Signed)
IC advised could pick up at front desk to take to pharmacy.  

## 2018-07-16 NOTE — Telephone Encounter (Signed)
y

## 2018-11-09 ENCOUNTER — Other Ambulatory Visit: Payer: Self-pay

## 2018-11-09 ENCOUNTER — Ambulatory Visit (INDEPENDENT_AMBULATORY_CARE_PROVIDER_SITE_OTHER): Payer: BC Managed Care – PPO | Admitting: Orthopedic Surgery

## 2018-11-09 ENCOUNTER — Encounter: Payer: Self-pay | Admitting: Orthopedic Surgery

## 2018-11-09 DIAGNOSIS — Z20822 Contact with and (suspected) exposure to covid-19: Secondary | ICD-10-CM

## 2018-11-09 DIAGNOSIS — M19011 Primary osteoarthritis, right shoulder: Secondary | ICD-10-CM | POA: Diagnosis not present

## 2018-11-10 LAB — NOVEL CORONAVIRUS, NAA: SARS-CoV-2, NAA: NOT DETECTED

## 2018-11-11 ENCOUNTER — Encounter: Payer: Self-pay | Admitting: Orthopedic Surgery

## 2018-11-11 DIAGNOSIS — M19011 Primary osteoarthritis, right shoulder: Secondary | ICD-10-CM

## 2018-11-11 MED ORDER — METHYLPREDNISOLONE ACETATE 40 MG/ML IJ SUSP
40.0000 mg | INTRAMUSCULAR | Status: AC | PRN
  Administered 2018-11-11: 09:00:00 40 mg via INTRA_ARTICULAR

## 2018-11-11 MED ORDER — LIDOCAINE HCL 1 % IJ SOLN
5.0000 mL | INTRAMUSCULAR | Status: AC | PRN
  Administered 2018-11-11: 09:00:00 5 mL

## 2018-11-11 MED ORDER — BUPIVACAINE HCL 0.5 % IJ SOLN
9.0000 mL | INTRAMUSCULAR | Status: AC | PRN
  Administered 2018-11-11: 9 mL via INTRA_ARTICULAR

## 2018-11-11 NOTE — Progress Notes (Signed)
Office Visit Note   Patient: Greg Munoz           Date of Birth: 1950/10/09           MRN: [id number] Visit Date: 11/09/2018 Requested by: Bernerd Limbo, MD [address] [place],  [place] [address] PCP: Bernerd Limbo, MD  Subjective: Chief Complaint  Patient presents with   Right Shoulder - Pain    HPI: Greg Munoz is a patient with known right shoulder arthritis.  He has had injections in the past.  Gets about 2 shoulder injections per year.  Taking ibuprofen 2 Merrick and fish oil.  States that the pain comes and goes.  Does not wake him from sleep but it will potentially keep him from sleeping at times.              ROS: All systems reviewed are negative as they relate to the chief complaint within the history of present illness.  Patient denies  fevers or chills.   Assessment & Plan: Visit Diagnoses:  1. Primary osteoarthritis of right shoulder     Plan: Impression is waxing and waning right shoulder pain from end-stage glenohumeral arthritis.  Plan is shoulder injection in the glenohumeral joint today.  That has helped him in the past.  Follow-up with me as needed.  He may need shoulder replacement at sometime in the future.  Knees are doing reasonably well.  Follow-Up Instructions: Return if symptoms worsen or fail to improve.   Orders:  No orders of the defined types were placed in this encounter.  No orders of the defined types were placed in this encounter.     Procedures: Large Joint Inj: R glenohumeral on 11/11/2018 9:10 AM Indications: diagnostic evaluation and pain Details: 18 G 1.5 in needle, posterior approach  Arthrogram: No  Medications: 9 mL bupivacaine 0.5 %; 40 mg methylPREDNISolone acetate 40 MG/ML; 5 mL lidocaine 1 % Outcome: tolerated well, no immediate complications Procedure, treatment alternatives, risks and benefits explained, specific risks discussed. Consent was given by the patient. Immediately prior to procedure a time out  was called to verify the correct patient, procedure, equipment, support staff and site/side marked as required. Patient was prepped and draped in the usual sterile fashion.       Clinical Data: No additional findings.  Objective: Vital Signs: There were no vitals taken for this visit.  Physical Exam:   Constitutional: Patient appears well-developed HEENT:  Head: Normocephalic Eyes:EOM are normal Neck: Normal range of motion Cardiovascular: Normal rate Pulmonary/chest: Effort normal Neurologic: Patient is alert Skin: Skin is warm Psychiatric: Patient has normal mood and affect    Ortho Exam: Ortho exam demonstrates full active and passive range of motion of the cervical spine.  Right shoulder has isolated glenohumeral abduction about 85 isolated forward flexion is about 95.  External rotation of 15 degrees of abduction is about 25.  Cuff strength is good.  Motor sensory function of the hand is intact  Specialty Comments:  No specialty comments available.  Imaging: No results found.   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 No pertinent past medical history.  History reviewed. No pertinent family history.  History reviewed. No pertinent surgical history. Social History   Occupational History   Not on file  Tobacco Use   Smoking status: Never Smoker   Smokeless tobacco: Never Used  Substance and Sexual Activity   Alcohol use: Not on file   Drug use: Not on file   Sexual activity: Not on file

## 2018-11-12 ENCOUNTER — Telehealth: Payer: Self-pay | Admitting: Orthopedic Surgery

## 2018-11-12 ENCOUNTER — Other Ambulatory Visit: Payer: Self-pay | Admitting: Surgical

## 2018-11-12 MED ORDER — HYDROCODONE-ACETAMINOPHEN 5-325 MG PO TABS: ORAL_TABLET | ORAL | 0 refills | Status: DC

## 2018-11-12 NOTE — Telephone Encounter (Signed)
Ok to rf pls clala thx

## 2018-11-12 NOTE — Telephone Encounter (Signed)
Patient called left message on vm stated he wanted a refill of Hydrocodone.  Please call patient [phone]

## 2018-11-12 NOTE — Telephone Encounter (Signed)
Can you please submit electronically.  

## 2018-11-12 NOTE — Telephone Encounter (Signed)
Please advise. Thanks.  

## 2018-11-13 ENCOUNTER — Telehealth: Payer: Self-pay | Admitting: Orthopedic Surgery

## 2018-11-13 NOTE — Telephone Encounter (Signed)
Pt called in requesting a refill on hydrocodone and please have that sent to [facility] on [address].   [phone]

## 2018-11-13 NOTE — Telephone Encounter (Signed)
IC advised this had already been done.

## 2018-11-17 ENCOUNTER — Other Ambulatory Visit: Payer: Self-pay | Admitting: Surgical

## 2018-11-17 ENCOUNTER — Telehealth: Payer: Self-pay | Admitting: Orthopedic Surgery

## 2018-11-17 MED ORDER — HYDROCODONE-ACETAMINOPHEN 5-325 MG PO TABS: ORAL_TABLET | ORAL | 0 refills | Status: DC

## 2018-11-17 NOTE — Telephone Encounter (Signed)
Patient called advised the Rx Hydrocodone is not showing called into the pharmacy. The number to contact patient is [phone]

## 2018-11-17 NOTE — Telephone Encounter (Signed)
Can you please resubmit this rx? IC verified with pharmacy and they never received rx that was sent on 08/27

## 2018-11-17 NOTE — Telephone Encounter (Signed)
Pt called back in said the prescription needs to be resent, pt says the pharmacy never received the medication.   [facility] on [address].   [phone]

## 2018-11-17 NOTE — Telephone Encounter (Signed)
IC advised resubmitted.

## 2018-11-17 NOTE — Telephone Encounter (Signed)
See note below please resubmit, never received by pharmacy.

## 2018-11-17 NOTE — Telephone Encounter (Signed)
IC advised 08/27 rx was submitted

## 2018-11-18 ENCOUNTER — Other Ambulatory Visit: Payer: Self-pay | Admitting: Surgical

## 2018-11-18 MED ORDER — HYDROCODONE-ACETAMINOPHEN 5-325 MG PO TABS: ORAL_TABLET | ORAL | 0 refills | Status: DC

## 2018-11-18 NOTE — Telephone Encounter (Signed)
Patient called stating that his pharmacy advised him that they can not accept a phoned in RX that it has to be electronically sent.  Thank you.

## 2018-11-18 NOTE — Telephone Encounter (Signed)
Lets look at this.

## 2019-01-07 ENCOUNTER — Other Ambulatory Visit: Payer: Self-pay

## 2019-01-07 DIAGNOSIS — Z20822 Contact with and (suspected) exposure to covid-19: Secondary | ICD-10-CM

## 2019-01-09 LAB — NOVEL CORONAVIRUS, NAA: SARS-CoV-2, NAA: NOT DETECTED

## 2019-01-27 ENCOUNTER — Telehealth: Payer: Self-pay

## 2019-01-27 ENCOUNTER — Other Ambulatory Visit: Payer: Self-pay | Admitting: Orthopedic Surgery

## 2019-01-27 MED ORDER — HYDROCODONE-ACETAMINOPHEN 5-325 MG PO TABS: ORAL_TABLET | ORAL | 0 refills | Status: DC

## 2019-01-27 NOTE — Telephone Encounter (Signed)
IC patient and advised rx submitted to pharmacy. He will let us know if he wants to proceed with surgery and at that point we will order thin cut CT scan.

## 2019-01-28 ENCOUNTER — Telehealth: Payer: Self-pay | Admitting: Orthopedic Surgery

## 2019-01-28 NOTE — Telephone Encounter (Signed)
Received voicemail message from patient advised Dr dean will need to contact the pharmacy to amend and update Rx. The number to contact patient is [phone]

## 2019-01-28 NOTE — Telephone Encounter (Signed)
Patient called left voicemail message concerning the increase of the Hydrocodone Rx. Patient said Dr Marlou Sa need to contact the pharmacy. The number to contact patient is [phone]

## 2019-01-28 NOTE — Telephone Encounter (Signed)
IC s/w pharmacist and advised ok to fill.

## 2019-01-28 NOTE — Telephone Encounter (Signed)
Tried calling patient. No answer. LMVM  Will try to call pharmacy.

## 2019-01-29 NOTE — Telephone Encounter (Signed)
This was addressed yesterday. I s/w pharmacist. I s/w patient and he was able to pick up rx today.

## 2019-02-02 ENCOUNTER — Telehealth: Payer: Self-pay | Admitting: Orthopedic Surgery

## 2019-02-02 NOTE — Telephone Encounter (Signed)
Please see below.

## 2019-02-02 NOTE — Telephone Encounter (Signed)
Pt of Dr Marlou Sa-

## 2019-02-02 NOTE — Telephone Encounter (Signed)
Patient called and wanted to know if its too early to have cortisone inj as well as needing a CT scan.  Patient requested for you to call him back.  [phone]

## 2019-02-03 NOTE — Telephone Encounter (Signed)
He has had 2 injections this year already.  If he was going to do another injection I would probably want to wait until December.  They do have diminishing returns over time.

## 2019-02-03 NOTE — Telephone Encounter (Signed)
Please advise on injection. His last one was 11/09/18.  I can call him about getting thin cut CT scan.

## 2019-02-04 NOTE — Telephone Encounter (Signed)
Tried calling patient, no answer. LMVM advising per Dr Marlou Sa. Also asked patient to [facility] to let me know if he was ready to proceed with scan.

## 2019-02-05 ENCOUNTER — Other Ambulatory Visit: Payer: Self-pay

## 2019-02-05 DIAGNOSIS — Z20822 Contact with and (suspected) exposure to covid-19: Secondary | ICD-10-CM

## 2019-02-08 LAB — NOVEL CORONAVIRUS, NAA: SARS-CoV-2, NAA: NOT DETECTED

## 2019-02-26 ENCOUNTER — Telehealth: Payer: Self-pay | Admitting: Orthopedic Surgery

## 2019-02-26 DIAGNOSIS — M19011 Primary osteoarthritis, right shoulder: Secondary | ICD-10-CM

## 2019-02-26 NOTE — Telephone Encounter (Signed)
Patient called. He would like to speak with you about setting up a CT scan. His call back number is [phone].

## 2019-03-01 NOTE — Addendum Note (Signed)
Addended byLaurann Montana on: 03/01/2019 10:50 AM   Modules accepted: Orders

## 2019-03-01 NOTE — Telephone Encounter (Signed)
IC s/w patient and he is ready to proceed with getting scan scheduled. I put in order for the scan.  However, patient has a lot of concerns and is very afraid of having the surgery but his pain has gotten much worse. Pain daily most of the time now.  He is concerned also about process of the surgery, said that he watched a total shoulder arthroplasty video on youtube. Can you please call patient and discuss with him questions/concerns that he has. Thanks.

## 2019-03-01 NOTE — Telephone Encounter (Signed)
I called.  Talked to him about shoulder replacement.  CT scan is pending for next week

## 2019-03-05 ENCOUNTER — Telehealth: Payer: Self-pay

## 2019-03-05 NOTE — Telephone Encounter (Signed)
Appt scheduled to review scan.

## 2019-03-09 ENCOUNTER — Ambulatory Visit
Admission: RE | Admit: 2019-03-09 | Discharge: 2019-03-09 | Disposition: A | Discharge status: Home / Self Care | Payer: BC Managed Care – PPO | Source: Ambulatory Visit | Attending: Orthopedic Surgery | Admitting: Orthopedic Surgery

## 2019-03-09 ENCOUNTER — Other Ambulatory Visit: Payer: Self-pay | Admitting: Orthopedic Surgery

## 2019-03-09 DIAGNOSIS — M1991 Primary osteoarthritis, unspecified site: Secondary | ICD-10-CM

## 2019-03-09 DIAGNOSIS — M19011 Primary osteoarthritis, right shoulder: Secondary | ICD-10-CM

## 2019-03-10 ENCOUNTER — Ambulatory Visit (INDEPENDENT_AMBULATORY_CARE_PROVIDER_SITE_OTHER): Payer: BC Managed Care – PPO | Admitting: Orthopedic Surgery

## 2019-03-10 ENCOUNTER — Other Ambulatory Visit: Payer: Self-pay

## 2019-03-10 DIAGNOSIS — M19011 Primary osteoarthritis, right shoulder: Secondary | ICD-10-CM | POA: Diagnosis not present

## 2019-03-11 ENCOUNTER — Encounter: Payer: Self-pay | Admitting: Orthopedic Surgery

## 2019-03-11 NOTE — Progress Notes (Signed)
Office Visit Note   Patient: Greg Munoz           Date of Birth: 10/03/1950           MRN: [id number] Visit Date: 03/10/2019 Requested by: Bernerd Limbo, MD [place],  [place] [id number] PCP: Bernerd Limbo, MD  Subjective: Chief Complaint  Patient presents with  . Right Shoulder - Follow-up    HPI: Greg Munoz is a patient with right shoulder pain.  He has known end-stage right shoulder arthritis.  Thin cut CT has been performed.  We have discussed in the past the risk and benefits of shoulder replacement including but limited to infection nerve vessel damage shoulder dislocation as well as implant longevity and potential need for revision.  Greg Munoz is taking occasional hydrocodone about once a day or once every other day.  He also has bilateral knee arthritis which is debilitating but not incapacitating.  The right shoulder pain has been bothering him significantly.  Interferes with his sleep as well as ability to do activities of daily living.              ROS: All systems reviewed are negative as they relate to the chief complaint within the history of present illness.  Patient denies  fevers or chills.   Assessment & Plan: Visit Diagnoses:  1. Primary osteoarthritis of right shoulder     Plan: Impression is right shoulder pain end-stage arthritis with adequate glenoid bone stock for total shoulder replacement.  Based on patient specific instrumentation and planning he may require a small glenoid augment.  The risk and benefits of surgery discussed.  I think the patient would like to be able to shoot basketball which I do see him being able to do with a well-functioning total shoulder replacement.  The rehabilitation goals including allowing the subscapularis muscle to heal for the first 6 weeks are discussed.  Plan to use CPM machine as well.  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  Patient understands risks and benefits and wishes to proceed.  All questions answered.  Follow-Up Instructions: No follow-ups on file.   Orders:  No orders of the defined types were placed in this encounter.  No orders of the defined types were placed in this encounter.     Procedures: No procedures performed   Clinical Data: No additional findings.  Objective: Vital Signs: There were no vitals taken for this visit.  Physical Exam:   Constitutional: Patient appears well-developed HEENT:  Head: Normocephalic Eyes:EOM are normal Neck: Normal range of motion Cardiovascular: Normal rate Pulmonary/chest: Effort normal Neurologic: Patient is alert Skin: Skin is warm Psychiatric: Patient has normal mood and affect    Ortho Exam: Ortho exam demonstrates limited passive range of motion with that right shoulder.  He has about 25 degrees of external rotation at 15 degrees of abduction.  Forward flexion and abduction also limited to around 90 degrees.  Rotator cuff strength is good isolated infraspinatus supraspinatus and subscap muscle testing.  Motor sensory function to the hand is intact.  Specialty Comments:  No specialty comments available.  Imaging: No results found.   PMFS History: Patient Active Problem List   Diagnosis Date Noted  . Sleep apnea in adult 10/20/2015  . Annual physical exam 11/24/2013  . Persistent insomnia 11/23/2012  . Elevated blood pressure reading 08/23/2011  .  Body mass index 27.0-27.9, adult 08/16/2011  . Herpes simplex 03/15/2011  . Disorder of external ear 11/27/2009  . 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 No pertinent past medical history.  History reviewed. No pertinent family history.  History reviewed. No pertinent surgical history. Social History   Occupational History  . Not on file  Tobacco Use  . Smoking status: Never Smoker  . Smokeless tobacco: Never Used    Substance and Sexual Activity  . Alcohol use: Not on file  . Drug use: Not on file  . Sexual activity: Not on file

## 2019-03-23 ENCOUNTER — Other Ambulatory Visit: Payer: Self-pay

## 2019-03-24 ENCOUNTER — Telehealth: Payer: Self-pay | Admitting: Orthopedic Surgery

## 2019-03-24 ENCOUNTER — Other Ambulatory Visit: Payer: Self-pay | Admitting: Surgical

## 2019-03-24 MED ORDER — HYDROCODONE-ACETAMINOPHEN 5-325 MG PO TABS: ORAL_TABLET | ORAL | 0 refills | Status: DC

## 2019-03-24 NOTE — Telephone Encounter (Signed)
IC s/w patient advised rx submitted.  Also answered questions he had concerning upcoming surgery.

## 2019-03-24 NOTE — Telephone Encounter (Signed)
Patient called requesting for a  Call back from Dr. August Saucer assistant or nurse with questions about upcoming surgery. Patient also requesting refill for hydrocodone at pharmacy [facility] on corner of [address] and [place]. Patient phone number is [phone].

## 2019-04-09 ENCOUNTER — Other Ambulatory Visit (HOSPITAL_COMMUNITY): Payer: BC Managed Care – PPO

## 2019-04-16 ENCOUNTER — Other Ambulatory Visit (HOSPITAL_COMMUNITY): Payer: BC Managed Care – PPO

## 2019-04-22 ENCOUNTER — Ambulatory Visit: Payer: BC Managed Care – PPO | Attending: Internal Medicine

## 2019-04-22 DIAGNOSIS — Z20822 Contact with and (suspected) exposure to covid-19: Secondary | ICD-10-CM | POA: Insufficient documentation

## 2019-04-23 LAB — NOVEL CORONAVIRUS, NAA: SARS-CoV-2, NAA: NOT DETECTED

## 2019-04-27 ENCOUNTER — Encounter (HOSPITAL_COMMUNITY): Payer: Self-pay

## 2019-04-27 NOTE — Progress Notes (Addendum)
Patient denies shortness of breath, fever, cough and chest pain.  PCP - Dr Tracey Harries Cardiologist - denies  Chest x-ray - denies EKG - denies Stress Test - denies ECHO - denies Cardiac Cath - denies  ICD Pacemaker/Loop-  Sleep Study - denies CPAP - none  ERAS: Clears til 4;30 am DOS, drink given.  Labs being faxed from Dr Bonney Leitz office that were drawn today.  Fax sent.  Anesthesia review: No  Coronavirus Screening Have you experienced the following symptoms:  Cough yes/no: No Fever (>100.28F)  yes/no: No Runny nose yes/no: No Sore throat yes/no: No Difficulty breathing/shortness of breath  yes/no: No  Have you traveled in the last 14 days and where? yes/no: No  Patient verbalized understanding of instructions that were given to them at the PAT appointment.

## 2019-04-27 NOTE — Progress Notes (Signed)
[facility] #[id number] Greg Munoz, [place] - [address] AT [facility] OF [address] [address] & [facility] [address] Greg Munoz [place] [address] Phone: [phone] Fax: [phone]     Your procedure is scheduled on Tuesday, 05/04/19 at 7:30 am..  Report to [facility] Main Entrance "A" at 5:30 A.M., and check in at the Admitting office.  Call this number if you have problems the morning of surgery:  [phone]  Call [phone] if you have any questions prior to your surgery date Monday-Friday 8am-4pm   Remember:  Do not eat or drink after midnight the night before your surgery (Monday)    Take these medicines the morning of surgery with A SIP OF WATER: ALPRAZolam Prudy Feeler) if needed HYDROcodone-acetaminophen (NORCO/VICODIN) if needed   7 days prior to surgery STOP taking any Aspirin (unless otherwise instructed by your surgeon), Aleve, Naproxen, Ibuprofen, Motrin, Advil, Goody's, BC's, all herbal medications, fish oil, and all vitamins.    The Morning of Surgery  Do not wear jewelry.  Do not wear lotions, powders, colognes or deodorant  Men may shave face and neck.  Do not bring valuables to the hospital.  [facility] is not responsible for any belongings or valuables.  If you are a smoker, DO NOT Smoke 24 hours prior to surgery  If you wear a CPAP at night please bring your mask the morning of surgery   Remember that you must have someone to transport you home after your surgery, and remain with you for 24 hours if you are discharged the same day.   Please bring cases for contacts, glasses, hearing aids, dentures or bridgework because it cannot be worn into surgery.   Leave your suitcase in the car.  After surgery it may be brought to your room.  For patients admitted to the hospital, discharge time will be determined by your treatment team.  Patients discharged the day of surgery will not be allowed to drive home.    Special instructions:   [place]- Preparing  For Surgery  Before surgery, you can play an important role. Because skin is not sterile, your skin needs to be as free of germs as possible. You can reduce the number of germs on your skin by washing with CHG (chlorahexidine gluconate) Soap before surgery.  CHG is an antiseptic cleaner which kills germs and bonds with the skin to continue killing germs even after washing.    Oral Hygiene is also important to reduce your risk of infection.  Remember - BRUSH YOUR TEETH THE MORNING OF SURGERY WITH YOUR REGULAR TOOTHPASTE  Please do not use if you have an allergy to CHG or antibacterial soaps. If your skin becomes reddened/irritated stop using the CHG.  Do not shave (including legs and underarms) for at least 48 hours prior to first CHG shower. It is OK to shave your face.  Please follow these instructions carefully.   1. Shower the NIGHT BEFORE SURGERY Monday and the MORNING OF SURGERY Tuesday with CHG Soap.   2. If you chose to wash your hair, wash your hair first as usual with your normal shampoo.  3. After you shampoo, rinse your hair and body thoroughly to remove the shampoo.  4. Use CHG as you would any other liquid soap. You can apply CHG directly to the skin and wash gently with a scrungie or a clean washcloth.   5. Apply the CHG Soap to your body ONLY FROM THE NECK DOWN.  Do not use on open  wounds or open sores. Avoid contact with your eyes, ears, mouth and genitals (private parts). Wash Face and genitals (private parts)  with your normal soap.   6. Wash thoroughly, paying special attention to the area where your surgery will be performed.  7. Thoroughly rinse your body with warm water from the neck down.  8. DO NOT shower/wash with your normal soap after using and rinsing off the CHG Soap.  9. Pat yourself dry with a CLEAN TOWEL.  10. Wear CLEAN PAJAMAS to bed the night before surgery, wear comfortable clothes the morning of surgery  11. Place CLEAN SHEETS on your bed the night  of your first shower and DO NOT SLEEP WITH PETS.    Day of Surgery:  Please shower the morning of surgery with the CHG soap Do not apply any deodorants/lotions. Please wear clean clothes to the hospital/surgery center.   Remember to brush your teeth WITH YOUR REGULAR TOOTHPASTE.   Please read over the following fact sheets that you were given.

## 2019-04-28 ENCOUNTER — Other Ambulatory Visit: Payer: Self-pay

## 2019-04-28 ENCOUNTER — Encounter (HOSPITAL_COMMUNITY)
Admission: RE | Admit: 2019-04-28 | Discharge: 2019-04-28 | Disposition: A | Discharge status: Home / Self Care | Payer: BC Managed Care – PPO | Source: Ambulatory Visit | Attending: Orthopedic Surgery | Admitting: Orthopedic Surgery

## 2019-04-28 ENCOUNTER — Encounter (HOSPITAL_COMMUNITY): Payer: Self-pay

## 2019-04-28 DIAGNOSIS — Z01812 Encounter for preprocedural laboratory examination: Secondary | ICD-10-CM | POA: Diagnosis present

## 2019-04-28 HISTORY — DX: Anxiety disorder, unspecified: F41.9

## 2019-04-28 HISTORY — DX: Herpesviral infection, unspecified: B00.9

## 2019-04-28 HISTORY — DX: Depression, unspecified: F32.A

## 2019-04-28 HISTORY — DX: Unspecified osteoarthritis, unspecified site: M19.90

## 2019-04-28 HISTORY — DX: Male erectile dysfunction, unspecified: N52.9

## 2019-04-28 LAB — URINALYSIS, ROUTINE W REFLEX MICROSCOPIC
Bilirubin Urine: NEGATIVE
Glucose, UA: NEGATIVE mg/dL
Hgb urine dipstick: NEGATIVE
Ketones, ur: NEGATIVE mg/dL
Leukocytes,Ua: NEGATIVE
Nitrite: NEGATIVE
Protein, ur: NEGATIVE mg/dL
Specific Gravity, Urine: 1.019 (ref 1.005–1.030)
pH: 5 (ref 5.0–8.0)

## 2019-04-28 LAB — SURGICAL PCR SCREEN
MRSA, PCR: NEGATIVE
Staphylococcus aureus: NEGATIVE

## 2019-04-28 NOTE — Progress Notes (Addendum)
[facility] DRUG STORE [place], [place] AT [address] [place] [address] Phone: [phone] Fax: [phone]     Your procedure is scheduled on Tuesday, 05/04/19 at 7:30 am..  Report to [facility] Main Entrance "A" at 5:30 A.M., and check in at the Admitting office.  Call this number if you have problems the morning of surgery:  [phone]  Call [phone] if you have any questions prior to your surgery date Monday-Friday 8am-4pm   Remember:  Do not eat after midnight the night before your surgery (Monday)    Take these medicines the morning of surgery with A SIP OF WATER: ALPRAZolam Duanne Moron) if needed HYDROcodone-acetaminophen (NORCO/VICODIN) if needed  You may drink clear liquids until 4:30 am the morning of your surgery.   Clear liquids allowed are: Water, Non-Citrus Juices (without pulp), Carbonated Beverages, Clear Tea, Black Coffee Only, and Gatorade  Please complete your PRE-SURGERY ENSURE that was provided to you by 4:30 am the morning of surgery.  Please, if able, drink it in one setting. DO NOT SIP.   STOP now taking any Aspirin (unless otherwise instructed by your surgeon), Aleve, Naproxen, Ibuprofen, Motrin, Advil, Goody's, BC's, all herbal medications, fish oil, and all vitamins.    The Morning of Surgery  Do not wear jewelry.  Do not wear lotions, powders, colognes or deodorant  Men may shave face and neck.  Do not bring valuables to the hospital.  [facility] is not responsible for any belongings or valuables.  If you are a smoker, DO NOT Smoke 24 hours prior to surgery  If you wear a CPAP at night please bring your mask the morning of surgery   Remember that you must have someone to transport you home after your surgery, and remain with you for 24 hours if you are discharged the same day.   Please bring cases for contacts, glasses, hearing aids, dentures or bridgework because it  cannot be worn into surgery.   Leave your suitcase in the car.  After surgery it may be brought to your room.  For patients admitted to the hospital, discharge time will be determined by your treatment team.  Patients discharged the day of surgery will not be allowed to drive home.    Special instructions:   [place]- Preparing For Surgery  Before surgery, you can play an important role. Because skin is not sterile, your skin needs to be as free of germs as possible. You can reduce the number of germs on your skin by washing with CHG (chlorahexidine gluconate) Soap before surgery.  CHG is an antiseptic cleaner which kills germs and bonds with the skin to continue killing germs even after washing.    Oral Hygiene is also important to reduce your risk of infection.  Remember - BRUSH YOUR TEETH THE MORNING OF SURGERY WITH YOUR REGULAR TOOTHPASTE  Please do not use if you have an allergy to CHG or antibacterial soaps. If your skin becomes reddened/irritated stop using the CHG.  Do not shave (including legs and underarms) for at least 48 hours prior to first CHG shower. It is OK to shave your face.  Please follow these instructions carefully.   1. Shower the NIGHT BEFORE SURGERY Monday and the MORNING OF SURGERY Tuesday with CHG Soap.   2. If you chose to wash your hair, wash your hair first as usual with your normal shampoo.  3. After you shampoo, rinse your  hair and body thoroughly to remove the shampoo.  4. Use CHG as you would any other liquid soap. You can apply CHG directly to the skin and wash gently with a scrungie or a clean washcloth.   5. Apply the CHG Soap to your body ONLY FROM THE NECK DOWN.  Do not use on open wounds or open sores. Avoid contact with your eyes, ears, mouth and genitals (private parts). Wash Face and genitals (private parts)  with your normal soap.   6. Wash thoroughly, paying special attention to the area where your surgery will be  performed.  7. Thoroughly rinse your body with warm water from the neck down.  8. DO NOT shower/wash with your normal soap after using and rinsing off the CHG Soap.  9. Pat yourself dry with a CLEAN TOWEL.  10. Wear CLEAN PAJAMAS to bed the night before surgery, wear comfortable clothes the morning of surgery  11. Place CLEAN SHEETS on your bed the night of your first shower and DO NOT SLEEP WITH PETS.    Day of Surgery:  Please shower the morning of surgery with the CHG soap Do not apply any deodorants/lotions. Please wear clean clothes to the hospital/surgery center.   Remember to brush your teeth WITH YOUR REGULAR TOOTHPASTE.   Please read over the following fact sheets that you were given.

## 2019-04-29 ENCOUNTER — Telehealth: Payer: Self-pay | Admitting: Orthopedic Surgery

## 2019-04-29 ENCOUNTER — Other Ambulatory Visit (HOSPITAL_COMMUNITY): Payer: BC Managed Care – PPO

## 2019-04-29 LAB — URINE CULTURE: Culture: <10000 — AB

## 2019-04-29 NOTE — Telephone Encounter (Signed)
IC s/w patient and advised. Verbalized understanding.  

## 2019-04-29 NOTE — Telephone Encounter (Signed)
Cpm 1 hour  3 x per per day and no lifting with right arm , - stay in sling until first postop appt

## 2019-04-29 NOTE — Telephone Encounter (Signed)
Patient called. He would like to know the plan after surgery. His call back number is [phone]

## 2019-04-29 NOTE — Telephone Encounter (Signed)
Please advise. Thanks.  

## 2019-04-30 ENCOUNTER — Other Ambulatory Visit (HOSPITAL_COMMUNITY)
Admission: RE | Admit: 2019-04-30 | Discharge: 2019-04-30 | Disposition: A | Discharge status: Home / Self Care | Payer: BC Managed Care – PPO | Source: Ambulatory Visit | Attending: Orthopedic Surgery | Admitting: Orthopedic Surgery

## 2019-04-30 DIAGNOSIS — Z01812 Encounter for preprocedural laboratory examination: Secondary | ICD-10-CM | POA: Diagnosis present

## 2019-04-30 DIAGNOSIS — Z20822 Contact with and (suspected) exposure to covid-19: Secondary | ICD-10-CM | POA: Insufficient documentation

## 2019-04-30 LAB — SARS CORONAVIRUS 2 (TAT 6-24 HRS): SARS Coronavirus 2: NEGATIVE

## 2019-05-03 ENCOUNTER — Other Ambulatory Visit: Payer: Self-pay | Admitting: Surgical

## 2019-05-03 ENCOUNTER — Telehealth: Payer: Self-pay | Admitting: *Deleted

## 2019-05-03 MED ORDER — CELECOXIB 200 MG PO CAPS: 200.0000 mg | ORAL_CAPSULE | Freq: Two times a day (BID) | ORAL | 0 refills | Status: DC

## 2019-05-03 NOTE — H&P (Signed)
Greg Munoz is an 69 y.o. male.   Chief Complaint: Right shoulder pain HPI: Greg Munoz is a 69 year old patient with right shoulder pain of long duration.  He is failed conservative management including multiple injections stretching rehab as well as activity modification.  He reports pain on a daily basis as well as pain which restricts his activities of daily living.  Presents now for operative management after explanation risk benefits.  Past Medical History:  Diagnosis Date  . Anxiety   . Arthritis    knee  . Depression   . ED (erectile dysfunction)   . HSV infection     Past Surgical History:  Procedure Laterality Date  . COLONOSCOPY  2012, 2018   x 2  . EYE SURGERY Bilateral    lasik  . KNEE SURGERY     arthroscopic knee - pt unsure of which leg  . WISDOM TOOTH EXTRACTION      No family history on file. Social History:  reports that he has never smoked. He has never used smokeless tobacco. He reports current alcohol use of about 7.0 - 10.0 standard drinks of alcohol per week. He reports that he does not use drugs.  Allergies: No Known Allergies  No medications prior to admission.    No results found for this or any previous visit (from the past 48 hour(s)). No results found.  Review of Systems  Musculoskeletal: Positive for arthralgias.  All other systems reviewed and are negative.   There were no vitals taken for this visit. Physical Exam  Constitutional: He appears well-developed.  HENT:  Head: Normocephalic.  Eyes: Pupils are equal, round, and reactive to light.  Cardiovascular: Normal rate.  Respiratory: Effort normal.  Musculoskeletal:     Cervical back: Normal range of motion.  Neurological: He is alert.  Skin: Skin is warm.  Psychiatric: He has a normal mood and affect.  Orthopedic exam demonstrates external rotation of 15 degrees abduction about 25 degrees.  Forward flexion and abduction both below 90.  Rotator cuff strength is excellent  infraspinatus supraspinatus subscap muscle testing.  Axillary nerve is functional.  Motor sensory function of the hand is intact.  Assessment/Plan Impression is right shoulder arthritis.  Plan is right total shoulder replacement.  Patient specific instrumentation will be utilized in order to optimize implant position.  The risk and benefits of surgery discussed include not limited to infection nerve vessel damage potential need for revision surgery as well as dislocation and incomplete pain relief.  Patient understands the risk and benefits.  All questions answered.  Burnard Bunting, MD 05/03/2019, 6:31 PM

## 2019-05-03 NOTE — Telephone Encounter (Signed)
Call to patient in anticipation of same day discharge after Right total shoulder arthroplasty scheduled for tomorrow, 05/04/19. Reviewed instructions per Dr. August Saucer of no lifting right arm, use of CPM. Patient confirms this was delivered on Friday and he was instructed on use. Reminded also of HHPT will be coming out on POD#1. CM will reach out to patient to check status after surgery and discharge home. Reminded to call with any questions or concerns.

## 2019-05-04 ENCOUNTER — Encounter (HOSPITAL_COMMUNITY)
Admission: RE | Disposition: A | Discharge status: Home / Self Care | Payer: Self-pay | Source: Home / Self Care | Attending: Orthopedic Surgery

## 2019-05-04 ENCOUNTER — Other Ambulatory Visit: Payer: Self-pay

## 2019-05-04 ENCOUNTER — Ambulatory Visit (HOSPITAL_COMMUNITY)
Admission: RE | Admit: 2019-05-04 | Discharge: 2019-05-04 | Disposition: A | Discharge status: Home / Self Care | Payer: BC Managed Care – PPO | Attending: Orthopedic Surgery | Admitting: Orthopedic Surgery

## 2019-05-04 ENCOUNTER — Ambulatory Visit (HOSPITAL_COMMUNITY): Payer: BC Managed Care – PPO | Admitting: Certified Registered Nurse Anesthetist

## 2019-05-04 ENCOUNTER — Encounter (HOSPITAL_COMMUNITY): Payer: Self-pay | Admitting: Orthopedic Surgery

## 2019-05-04 DIAGNOSIS — F329 Major depressive disorder, single episode, unspecified: Secondary | ICD-10-CM | POA: Insufficient documentation

## 2019-05-04 DIAGNOSIS — I10 Essential (primary) hypertension: Secondary | ICD-10-CM | POA: Insufficient documentation

## 2019-05-04 DIAGNOSIS — G4730 Sleep apnea, unspecified: Secondary | ICD-10-CM | POA: Insufficient documentation

## 2019-05-04 DIAGNOSIS — M1990 Unspecified osteoarthritis, unspecified site: Secondary | ICD-10-CM | POA: Diagnosis not present

## 2019-05-04 DIAGNOSIS — M19011 Primary osteoarthritis, right shoulder: Secondary | ICD-10-CM | POA: Diagnosis not present

## 2019-05-04 DIAGNOSIS — F419 Anxiety disorder, unspecified: Secondary | ICD-10-CM | POA: Diagnosis not present

## 2019-05-04 HISTORY — PX: TOTAL SHOULDER ARTHROPLASTY: SHX126

## 2019-05-04 SURGERY — ARTHROPLASTY, SHOULDER, TOTAL
Anesthesia: General | Site: Shoulder | Laterality: Right

## 2019-05-04 MED ORDER — TRANEXAMIC ACID-NACL 1000-0.7 MG/100ML-% IV SOLN
INTRAVENOUS | Status: AC
  Filled 2019-05-04: qty 100

## 2019-05-04 MED ORDER — VANCOMYCIN HCL 1000 MG IV SOLR
INTRAVENOUS | Status: AC
  Filled 2019-05-04: qty 1000

## 2019-05-04 MED ORDER — PROMETHAZINE HCL 25 MG/ML IJ SOLN: 6.2500 mg | INTRAMUSCULAR | Status: DC | PRN

## 2019-05-04 MED ORDER — FENTANYL CITRATE (PF) 100 MCG/2ML IJ SOLN
INTRAMUSCULAR | Status: DC | PRN
  Administered 2019-05-04 (×5): 50 ug via INTRAVENOUS

## 2019-05-04 MED ORDER — SUGAMMADEX SODIUM 200 MG/2ML IV SOLN
INTRAVENOUS | Status: DC | PRN
  Administered 2019-05-04: 200 mg via INTRAVENOUS

## 2019-05-04 MED ORDER — BUPIVACAINE HCL (PF) 0.25 % IJ SOLN
INTRAMUSCULAR | Status: DC | PRN
  Administered 2019-05-04: 30 mL

## 2019-05-04 MED ORDER — VANCOMYCIN HCL 1000 MG IV SOLR
INTRAVENOUS | Status: DC | PRN
  Administered 2019-05-04: 1000 mg via TOPICAL

## 2019-05-04 MED ORDER — HEMOSTATIC AGENTS (NO CHARGE) OPTIME
TOPICAL | Status: DC | PRN
  Administered 2019-05-04: 1 via TOPICAL

## 2019-05-04 MED ORDER — CLONIDINE HCL (ANALGESIA) 100 MCG/ML EP SOLN
EPIDURAL | Status: DC | PRN
  Administered 2019-05-04: 100 ug

## 2019-05-04 MED ORDER — CEFAZOLIN SODIUM-DEXTROSE 2-4 GM/100ML-% IV SOLN: 2.0000 g | Freq: Three times a day (TID) | INTRAVENOUS | Status: DC

## 2019-05-04 MED ORDER — LIDOCAINE 2% (20 MG/ML) 5 ML SYRINGE
INTRAMUSCULAR | Status: AC
  Filled 2019-05-04: qty 5

## 2019-05-04 MED ORDER — BUPIVACAINE LIPOSOME 1.3 % IJ SUSP
INTRAMUSCULAR | Status: DC | PRN
  Administered 2019-05-04: 10 mL via PERINEURAL

## 2019-05-04 MED ORDER — PHENYLEPHRINE HCL-NACL 10-0.9 MG/250ML-% IV SOLN
INTRAVENOUS | Status: DC | PRN
  Administered 2019-05-04: 20 ug/min via INTRAVENOUS

## 2019-05-04 MED ORDER — CHLORHEXIDINE GLUCONATE 4 % EX LIQD: 60.0000 mL | Freq: Once | CUTANEOUS | Status: DC

## 2019-05-04 MED ORDER — MIDAZOLAM HCL 5 MG/5ML IJ SOLN
INTRAMUSCULAR | Status: DC | PRN
  Administered 2019-05-04: 2 mg via INTRAVENOUS

## 2019-05-04 MED ORDER — PROPOFOL 10 MG/ML IV BOLUS
INTRAVENOUS | Status: DC | PRN
  Administered 2019-05-04: 200 mg via INTRAVENOUS

## 2019-05-04 MED ORDER — LIDOCAINE HCL (CARDIAC) PF 100 MG/5ML IV SOSY
PREFILLED_SYRINGE | INTRAVENOUS | Status: DC | PRN
  Administered 2019-05-04: 100 mg via INTRAVENOUS

## 2019-05-04 MED ORDER — TRANEXAMIC ACID-NACL 1000-0.7 MG/100ML-% IV SOLN
1000.0000 mg | INTRAVENOUS | Status: AC
  Administered 2019-05-04: 1000 mg via INTRAVENOUS

## 2019-05-04 MED ORDER — ONDANSETRON HCL 4 MG/2ML IJ SOLN
INTRAMUSCULAR | Status: DC | PRN
  Administered 2019-05-04: 4 mg via INTRAVENOUS

## 2019-05-04 MED ORDER — OXYCODONE HCL 5 MG PO TABS: 5.0000 mg | ORAL_TABLET | ORAL | 0 refills | Status: DC | PRN

## 2019-05-04 MED ORDER — IRRISEPT - 450ML BOTTLE WITH 0.05% CHG IN STERILE WATER, USP 99.95% OPTIME
TOPICAL | Status: DC | PRN
  Administered 2019-05-04: 450 mL

## 2019-05-04 MED ORDER — CEFAZOLIN SODIUM-DEXTROSE 2-4 GM/100ML-% IV SOLN
2.0000 g | INTRAVENOUS | Status: AC
  Administered 2019-05-04: 2 g via INTRAVENOUS

## 2019-05-04 MED ORDER — CLONIDINE HCL (ANALGESIA) 100 MCG/ML EP SOLN
EPIDURAL | Status: AC
  Filled 2019-05-04: qty 10

## 2019-05-04 MED ORDER — MORPHINE SULFATE (PF) 4 MG/ML IV SOLN
INTRAVENOUS | Status: AC
  Filled 2019-05-04: qty 2

## 2019-05-04 MED ORDER — ROCURONIUM BROMIDE 100 MG/10ML IV SOLN
INTRAVENOUS | Status: DC | PRN
  Administered 2019-05-04: 70 mg via INTRAVENOUS

## 2019-05-04 MED ORDER — BUPIVACAINE HCL (PF) 0.5 % IJ SOLN
INTRAMUSCULAR | Status: DC | PRN
  Administered 2019-05-04: 15 mL via PERINEURAL

## 2019-05-04 MED ORDER — ASPIRIN EC 81 MG PO TBEC: 81.0000 mg | DELAYED_RELEASE_TABLET | Freq: Every day | ORAL | 0 refills | Status: AC

## 2019-05-04 MED ORDER — LACTATED RINGERS IV SOLN: INTRAVENOUS | Status: DC | PRN

## 2019-05-04 MED ORDER — VANCOMYCIN HCL 500 MG IV SOLR
INTRAVENOUS | Status: AC
  Filled 2019-05-04: qty 500

## 2019-05-04 MED ORDER — KETOROLAC TROMETHAMINE 30 MG/ML IJ SOLN: 30.0000 mg | Freq: Once | INTRAMUSCULAR | Status: DC | PRN

## 2019-05-04 MED ORDER — ONDANSETRON HCL 4 MG/2ML IJ SOLN
INTRAMUSCULAR | Status: AC
  Filled 2019-05-04: qty 2

## 2019-05-04 MED ORDER — MORPHINE SULFATE (PF) 4 MG/ML IV SOLN
INTRAVENOUS | Status: DC | PRN
  Administered 2019-05-04: 8 mg via INTRAVENOUS

## 2019-05-04 MED ORDER — CEFAZOLIN SODIUM-DEXTROSE 2-4 GM/100ML-% IV SOLN
INTRAVENOUS | Status: AC
  Filled 2019-05-04: qty 100

## 2019-05-04 MED ORDER — HYDROMORPHONE HCL 1 MG/ML IJ SOLN: 0.2500 mg | INTRAMUSCULAR | Status: DC | PRN

## 2019-05-04 MED ORDER — PROPOFOL 10 MG/ML IV BOLUS
INTRAVENOUS | Status: AC
  Filled 2019-05-04: qty 20

## 2019-05-04 MED ORDER — PHENYLEPHRINE HCL (PRESSORS) 10 MG/ML IV SOLN
INTRAVENOUS | Status: DC | PRN
  Administered 2019-05-04: 80 ug via INTRAVENOUS

## 2019-05-04 MED ORDER — BUPIVACAINE HCL (PF) 0.25 % IJ SOLN
INTRAMUSCULAR | Status: AC
  Filled 2019-05-04: qty 30

## 2019-05-04 MED ORDER — FENTANYL CITRATE (PF) 250 MCG/5ML IJ SOLN
INTRAMUSCULAR | Status: AC
  Filled 2019-05-04: qty 5

## 2019-05-04 MED ORDER — METHOCARBAMOL 500 MG PO TABS: 500.0000 mg | ORAL_TABLET | Freq: Three times a day (TID) | ORAL | 0 refills | Status: AC | PRN

## 2019-05-04 MED ORDER — MIDAZOLAM HCL 2 MG/2ML IJ SOLN
INTRAMUSCULAR | Status: AC
  Filled 2019-05-04: qty 2

## 2019-05-04 MED ORDER — 0.9 % SODIUM CHLORIDE (POUR BTL) OPTIME
TOPICAL | Status: DC | PRN
  Administered 2019-05-04 (×7): 1000 mL

## 2019-05-04 MED ORDER — ALBUMIN HUMAN 5 % IV SOLN: INTRAVENOUS | Status: DC | PRN

## 2019-05-04 SURGICAL SUPPLY — 88 items
ADPR HD STD TPR HUM TI RVRS (Orthopedic Implant) ×1 IMPLANT
AID PSTN UNV HD RSTRNT DISP (MISCELLANEOUS) ×1
ALCOHOL 70% 16 OZ (MISCELLANEOUS) ×3 IMPLANT
APL PRP STRL LF DISP 70% ISPRP (MISCELLANEOUS) ×2
BLADE SAW SGTL 13X75X1.27 (BLADE) ×3 IMPLANT
CEMENT BONE R 1X40 (Cement) ×2 IMPLANT
CHLORAPREP W/TINT 26 (MISCELLANEOUS) ×6 IMPLANT
CLOSURE WOUND 1/2 X4 (GAUZE/BANDAGES/DRESSINGS) ×1
COVER SURGICAL LIGHT HANDLE (MISCELLANEOUS) ×3 IMPLANT
COVER WAND RF STERILE (DRAPES) ×1 IMPLANT
DRAPE INCISE IOBAN 66X45 STRL (DRAPES) ×5 IMPLANT
DRAPE U-SHAPE 47X51 STRL (DRAPES) ×6 IMPLANT
DRSG AQUACEL AG ADV 3.5X10 (GAUZE/BANDAGES/DRESSINGS) ×3 IMPLANT
ELECT BLADE 4.0 EZ CLEAN MEGAD (MISCELLANEOUS) ×6
ELECT REM PT RETURN 9FT ADLT (ELECTROSURGICAL) ×3
ELECTRODE BLDE 4.0 EZ CLN MEGD (MISCELLANEOUS) IMPLANT
ELECTRODE REM PT RTRN 9FT ADLT (ELECTROSURGICAL) ×1 IMPLANT
GAUZE SPONGE 4X4 12PLY STRL LF (GAUZE/BANDAGES/DRESSINGS) ×3 IMPLANT
GLENOID MOD AUG 4 PEG RT SZ 5 (Shoulder) ×2 IMPLANT
GLENOID MOD POST TM SZ2 (Post) ×2 IMPLANT
GLOVE BIOGEL PI IND STRL 7.0 (GLOVE) ×1 IMPLANT
GLOVE BIOGEL PI IND STRL 8 (GLOVE) ×1 IMPLANT
GLOVE BIOGEL PI INDICATOR 7.0 (GLOVE) ×2
GLOVE BIOGEL PI INDICATOR 8 (GLOVE) ×2
GLOVE ECLIPSE 7.0 STRL STRAW (GLOVE) ×3 IMPLANT
GLOVE ECLIPSE 8.0 STRL XLNG CF (GLOVE) ×9 IMPLANT
GOWN STRL REUS W/ TWL LRG LVL3 (GOWN DISPOSABLE) ×2 IMPLANT
GOWN STRL REUS W/ TWL XL LVL3 (GOWN DISPOSABLE) ×1 IMPLANT
GOWN STRL REUS W/TWL LRG LVL3 (GOWN DISPOSABLE) ×9
GOWN STRL REUS W/TWL XL LVL3 (GOWN DISPOSABLE) ×6
GUIDE MODEL REV SHLD RT SZ2 (ORTHOPEDIC DISPOSABLE SUPPLIES) ×2 IMPLANT
HEAD HUMERAL BIPOLAR 54X24X58M (Stem) ×2 IMPLANT
HEAD HUMERAL COMP STD (Orthopedic Implant) IMPLANT
HUMERAL HEAD COMP STD (Orthopedic Implant) ×3 IMPLANT
HYDROGEN PEROXIDE 16OZ (MISCELLANEOUS) ×3 IMPLANT
JET LAVAGE IRRISEPT WOUND (IRRIGATION / IRRIGATOR) ×3
KIT BASIN OR (CUSTOM PROCEDURE TRAY) ×3 IMPLANT
KIT TURNOVER KIT B (KITS) ×3 IMPLANT
LAVAGE JET IRRISEPT WOUND (IRRIGATION / IRRIGATOR) ×1 IMPLANT
LOOP VESSEL MAXI BLUE (MISCELLANEOUS) ×3 IMPLANT
MANIFOLD NEPTUNE II (INSTRUMENTS) ×3 IMPLANT
NDL 18GX1X1/2 (RX/OR ONLY) (NEEDLE) IMPLANT
NDL HYPO 25GX1X1/2 BEV (NEEDLE) IMPLANT
NDL SUT 6 .5 CRC .975X.05 MAYO (NEEDLE) ×1 IMPLANT
NEEDLE 18GX1X1/2 (RX/OR ONLY) (NEEDLE) ×3 IMPLANT
NEEDLE HYPO 25GX1X1/2 BEV (NEEDLE) ×3 IMPLANT
NEEDLE MAYO TAPER (NEEDLE) ×3
NS IRRIG 1000ML POUR BTL (IV SOLUTION) ×3 IMPLANT
PACK SHOULDER (CUSTOM PROCEDURE TRAY) ×3 IMPLANT
PAD ARMBOARD 7.5X6 YLW CONV (MISCELLANEOUS) ×6 IMPLANT
PIN HUMERAL STMN 3.2MMX9IN (INSTRUMENTS) ×2 IMPLANT
PIN STEINMANN THREADED TIP (PIN) ×2 IMPLANT
PIN THREADED REVERSE (PIN) ×4 IMPLANT
REAMER GUIDE AUG PEG RT 4 (ORTHOPEDIC DISPOSABLE SUPPLIES) ×2 IMPLANT
RESTRAINT HEAD UNIVERSAL NS (MISCELLANEOUS) ×3 IMPLANT
RETRIEVER SUT HEWSON (MISCELLANEOUS) ×3 IMPLANT
SLING ARM IMMOBILIZER LRG (SOFTGOODS) ×3 IMPLANT
SMARTMIX MINI TOWER (MISCELLANEOUS) ×3
SOL PREP POV-IOD 4OZ 10% (MISCELLANEOUS) ×3 IMPLANT
SPONGE LAP 18X18 RF (DISPOSABLE) ×9 IMPLANT
STEM HUMERAL STRL 15MMX140MM (Stem) ×3 IMPLANT
STRIP CLOSURE SKIN 1/2X4 (GAUZE/BANDAGES/DRESSINGS) ×2 IMPLANT
SUCTION FRAZIER HANDLE 10FR (MISCELLANEOUS) ×2
SUCTION TUBE FRAZIER 10FR DISP (MISCELLANEOUS) ×1 IMPLANT
SUT BROADBAND TAPE 2PK 1.5 (SUTURE) ×6 IMPLANT
SUT FIBERWIRE #2 38 T-5 BLUE (SUTURE)
SUT MAXBRAID (SUTURE) IMPLANT
SUT MNCRL AB 3-0 PS2 18 (SUTURE) ×3 IMPLANT
SUT SILK 2 0 TIES 10X30 (SUTURE) ×3 IMPLANT
SUT VIC AB 0 CT1 27 (SUTURE) ×6
SUT VIC AB 0 CT1 27XBRD ANBCTR (SUTURE) ×2 IMPLANT
SUT VIC AB 1 CT1 27 (SUTURE) ×6
SUT VIC AB 1 CT1 27XBRD ANBCTR (SUTURE) ×1 IMPLANT
SUT VIC AB 2-0 CT1 27 (SUTURE) ×9
SUT VIC AB 2-0 CT1 TAPERPNT 27 (SUTURE) ×2 IMPLANT
SUT VICRYL 0 UR6 27IN ABS (SUTURE) ×16 IMPLANT
SUTURE FIBERWR #2 38 T-5 BLUE (SUTURE) IMPLANT
SUTURE TAPE 1.3 40 TPR END (SUTURE) IMPLANT
SUTURETAPE 1.3 40 TPR END (SUTURE)
SYR 10ML LL (SYRINGE) ×2 IMPLANT
SYR CONTROL 10ML LL (SYRINGE) IMPLANT
SYR TB 1ML LUER SLIP (SYRINGE) ×2 IMPLANT
TOWEL GREEN STERILE (TOWEL DISPOSABLE) ×7 IMPLANT
TOWEL GREEN STERILE FF (TOWEL DISPOSABLE) ×3 IMPLANT
TOWER SMARTMIX MINI (MISCELLANEOUS) IMPLANT
TRAY FOL W/BAG SLVR 16FR STRL (SET/KITS/TRAYS/PACK) IMPLANT
TRAY FOLEY W/BAG SLVR 16FR LF (SET/KITS/TRAYS/PACK)
WATER STERILE IRR 1000ML POUR (IV SOLUTION) ×3 IMPLANT

## 2019-05-04 NOTE — Anesthesia Procedure Notes (Signed)
Procedure Name: Intubation Date/Time: 05/04/2019 7:41 AM Performed by: Tammi Boulier T, CRNA Pre-anesthesia Checklist: Patient identified, Emergency Drugs available, Suction available and Patient being monitored Patient Re-evaluated:Patient Re-evaluated prior to induction Oxygen Delivery Method: Circle system utilized Preoxygenation: Pre-oxygenation with 100% oxygen Induction Type: IV induction Ventilation: Mask ventilation without difficulty Laryngoscope Size: Miller and 3 Grade View: Grade I Tube type: Oral Tube size: 7.5 mm Number of attempts: 1 Airway Equipment and Method: Stylet and Oral airway Placement Confirmation: ETT inserted through vocal cords under direct vision,  positive ETCO2 and breath sounds checked- equal and bilateral Secured at: 22 cm Tube secured with: Tape Dental Injury: Teeth and Oropharynx as per pre-operative assessment

## 2019-05-04 NOTE — Anesthesia Preprocedure Evaluation (Addendum)
Anesthesia Evaluation  Patient identified by MRN, date of birth, ID band Patient awake    Reviewed: Allergy & Precautions, NPO status , Patient's Chart, lab work & pertinent test results  Airway Mallampati: II  TM Distance: >3 FB Neck ROM: Full    Dental no notable dental hx. (+) Dental Advisory Given   Pulmonary sleep apnea ,    Pulmonary exam normal breath sounds clear to auscultation       Cardiovascular hypertension, Normal cardiovascular exam Rhythm:Regular Rate:Normal  Untreated HTN   Neuro/Psych Anxiety negative neurological ROS     GI/Hepatic negative GI ROS, Neg liver ROS,   Endo/Other  negative endocrine ROS  Renal/GU negative Renal ROS  negative genitourinary   Musculoskeletal  (+) Arthritis , Osteoarthritis,    Abdominal   Peds negative pediatric ROS (+)  Hematology negative hematology ROS (+)   Anesthesia Other Findings   Reproductive/Obstetrics negative OB ROS                           Anesthesia Physical Anesthesia Plan  ASA: III  Anesthesia Plan: General   Post-op Pain Management:  Regional for Post-op pain   Induction: Intravenous  PONV Risk Score and Plan: 2 and Ondansetron, Dexamethasone and Treatment may vary due to age or medical condition  Airway Management Planned: Oral ETT  Additional Equipment:   Intra-op Plan:   Post-operative Plan: Extubation in OR  Informed Consent: I have reviewed the patients History and Physical, chart, labs and discussed the procedure including the risks, benefits and alternatives for the proposed anesthesia with the patient or authorized representative who has indicated his/her understanding and acceptance.     Dental advisory given  Plan Discussed with: CRNA and Surgeon  Anesthesia Plan Comments:        Anesthesia Quick Evaluation

## 2019-05-04 NOTE — Transfer of Care (Signed)
Immediate Anesthesia Transfer of Care Note  Patient: Greg Munoz  Procedure(s) Performed: RIGHT TOTAL SHOULDER ARTHROPLASTY (Right Shoulder)  Patient Location: PACU  Anesthesia Type:GA combined with regional for post-op pain  Level of Consciousness: awake, alert  and oriented  Airway & Oxygen Therapy: Patient Spontanous Breathing and Patient connected to nasal cannula oxygen  Post-op Assessment: Report given to RN and Post -op Vital signs reviewed and stable  Post vital signs: Reviewed and stable  Last Vitals:  Vitals Value Taken Time  BP 119/64 05/04/19 1205  Temp 37.1 C 05/04/19 1205  Pulse 87 05/04/19 1208  Resp 19 05/04/19 1208  SpO2 94 % 05/04/19 1208  Vitals shown include unvalidated device data.  Last Pain:  Vitals:   05/04/19 1205  TempSrc:   PainSc: Asleep      Patients Stated Pain Goal: 2 (05/04/19 0558)  Complications: No apparent anesthesia complications

## 2019-05-04 NOTE — Brief Op Note (Signed)
   05/04/2019  12:01 PM  PATIENT:  Greg Munoz  69 y.o. male  PRE-OPERATIVE DIAGNOSIS:  right shoulder osteoarthritis  POST-OPERATIVE DIAGNOSIS:  right shoulder osteoarthritis  PROCEDURE:  Procedure(s): RIGHT TOTAL SHOULDER ARTHROPLASTY  SURGEON:  Surgeon(s): Cammy Copa, MD  ASSISTANT: magnant pa  ANESTHESIA:   general  EBL: 100 ml    Total I/O In: 1250 [I.V.:1000; IV Piggyback:250] Out: 500 [Urine:300; Blood:200]  BLOOD ADMINISTERED: none  DRAINS: none   LOCAL MEDICATIONS USED: Marcaine morphine clonidine  SPECIMEN:  No Specimen  COUNTS:  YES  TOURNIQUET:  * No tourniquets in log *  DICTATION: .Other Dictation: Dictation Number [id number]  PLAN OF CARE: Discharge to home after PACU  PATIENT DISPOSITION:  PACU - hemodynamically stable

## 2019-05-04 NOTE — Anesthesia Postprocedure Evaluation (Signed)
Anesthesia Post Note  Patient: Greg Munoz  Procedure(s) Performed: RIGHT TOTAL SHOULDER ARTHROPLASTY (Right Shoulder)     Patient location during evaluation: PACU Anesthesia Type: General Level of consciousness: awake and alert Pain management: pain level controlled Vital Signs Assessment: post-procedure vital signs reviewed and stable Respiratory status: spontaneous breathing, nonlabored ventilation, respiratory function stable and patient connected to nasal cannula oxygen Cardiovascular status: blood pressure returned to baseline and stable Postop Assessment: no apparent nausea or vomiting Anesthetic complications: no    Last Vitals:  Vitals:   05/04/19 1220 05/04/19 1235  BP: 97/60 (!) 100/59  Pulse: 79 77  Resp: 19 20  Temp:  37 C  SpO2: 93% 94%    Last Pain:  Vitals:   05/04/19 1235  TempSrc:   PainSc: 0-No pain                 Ekam Bonebrake S

## 2019-05-04 NOTE — Progress Notes (Addendum)
Patient arrived to short stay with elevated BP, no known history of hypertension. Dr. Okey Dupre made aware and open communication with Rokoshi, CRNA and made him aware.  Will continue to monitor patient.

## 2019-05-04 NOTE — Op Note (Signed)
NAME: SOPHEAP, BOEHLE MEDICAL RECORD IF:[id number] ACCOUNT [id number] DATE OF BIRTH:1951-03-03 FACILITY: [facility] LOCATION: [facility] PHYSICIAN:Ransom Nickson Diamantina Providence, MD  OPERATIVE REPORT  DATE OF PROCEDURE:  05/04/2019  PREOPERATIVE DIAGNOSIS:  Right shoulder arthritis.  POSTOPERATIVE DIAGNOSIS:  Right shoulder arthritis.  PROCEDURE:  Right total shoulder replacement using Biomet micro 15 mm stem with comprehensive shoulder system modular head variable offset, 54 mm x 24 mm in height and glenoid 4 peg modular glenoid baseplate with augmentation size 5.  SURGEON:  Cammy Copa, MD  ASSISTANT:  Karenann Cai, PA  INDICATIONS:  Truitt is a 69 year old patient with right shoulder pain, presents for operative management after explanation of risks and benefits.  He has failed conservative management.  PROCEDURE IN DETAIL:  The patient was brought to the operating room where general anesthetic was induced.  Preoperative antibiotics administered.  Timeout was called.  The patient was placed with his head in neutral position in the beach chair position.   Right arm was pre-scrubbed with hydrogen peroxide along with alcohol and Betadine, which was allowed to air dry, then prepped with DuraPrep solution.  It should be noted that he did do a 3-day daily benzoyl peroxide scrub in the shoulder and axilla  area.  Timeout was called.  Ioban used to cover the operative field.  A deltopectoral incision was made.  Skin was incised.  Cephalic vein mobilized medially.  The patient was muscular.  The subdeltoid adhesions were released.  Biceps was tenodesed to  the pectoralis major tendon.  This tendon actually required release of about 1.5 cm in order to achieve exposure.  The rotator interval was opened all the way to the glenoid.  The circumflex vessels were ligated using silk ligature.  At this time,  axillary nerve was identified and a vessel loop placed around it.  It was protected at all times during the  remaining portion of the case.  Next, the subscapularis was detached using a 15 blade.  It was tagged.  Humeral head was then cut in 30 degrees of  retroversion after removing osteophytes from the inferior humeral neck and releasing the capsule about 2 cm inferior to the humeral neck down to around the 5 o'clock position.  Significant arthritis was present.  Irricept was utilized after the skin  incision as well as during the arthrotomy and at all times during the case to diminish chances of infection.  Next, the humeral head was cut in 30 degrees of retroversion and placed on the back table.  Broaching was performed up to size 15 and a cap was  placed.  Next, attention was directed towards the glenoid.  The subscapularis muscle underwent 360 degree release with care being taken to avoid injury to the axillary nerve.  The labrum was excised from the 12 o'clock down to the 6 o'clock position.   The deltoid was released manually anterior portion to assist with exposure.  Next, the anterior retractor and posterior retractors were placed.  The humeral head was retracted posteriorly and using the patient specific guide the optimal position for the  implant was achieved.  Next, reaming was performed a unspecified amount in accordance with preoperative templating.  The augmented reamer was then also placed.  A trial glenoid was placed after drilling the 3 pegs.  It fit nicely on the bleeding bony  surface.  Next, the glenoid was thoroughly irrigated.  Surgicel was placed within the 3 pegs and the glenoid was then placed into position with excellent press fit  obtained.  Next, attention was directed towards the humerus.  A 54 x 24 mm head was placed  and the patient had a very good posterior translation, 50% inferior translation, 50% as well as internal rotation just shy of parallel with the torso.  The patient had good stability.  Next, the trial components were removed from the humeral side and  the true components  placed after drilling 4 drill holes through the lesser tuberosity for subscap repair.  Same stability parameters were maintained.  The humeral head was placed just slightly above the level of the tuberosity.  Offset was about the 2  o'clock position.  Next, the same parameters were maintained.  The subscapularis was repaired using 5 Biomet suture tapes.  These were placed and tied in Nice knot fashion with the arm in about 20 degrees of external rotation.  Following this, the  incision was thoroughly irrigated.  This was done with 3 liters of saline as well as IrriSept solution.  Vancomycin powder placed within the joint itself.  The rotator interval was then closed in 20 and 30 degrees of external rotation.  Next, a thorough  irrigation was performed and the deltopectoral interval was closed over vancomycin powder using #1 Vicryl suture followed by interrupted inverted 0 Vicryl suture, 2-0 Vicryl suture and a 3-0 Monocryl.  Steri-Strips and Aquacel dressing applied.  Shoulder  immobilizer applied.  The patient tolerated procedure well without immediate complications, transferred to the recovery room in stable condition.  Luke's assistance was required for mobilization, retraction, opening and closing.  His assistant was of  medical necessity.  CN/NUANCE  D:05/04/2019 T:05/04/2019 JOB:[id number]

## 2019-05-04 NOTE — Anesthesia Procedure Notes (Signed)
Anesthesia Procedure Image    

## 2019-05-04 NOTE — Interval H&P Note (Signed)
History and Physical Interval Note:  05/04/2019 7:18 AM  Greg Munoz  has presented today for surgery, with the diagnosis of right shoulder osteoarthritis.  The various methods of treatment have been discussed with the patient and family. After consideration of risks, benefits and other options for treatment, the patient has consented to  Procedure(s): RIGHT TOTAL SHOULDER ARTHROPLASTY (Right) as a surgical intervention.  The patient's history has been reviewed, patient examined, no change in status, stable for surgery.  I have reviewed the patient's chart and labs.  Questions were answered to the patient's satisfaction.     Burnard Bunting

## 2019-05-04 NOTE — Anesthesia Procedure Notes (Signed)
Anesthesia Regional Block: Interscalene brachial plexus block   Pre-Anesthetic Checklist: ,, timeout performed, Correct Patient, Correct Site, Correct Laterality, Correct Procedure, Correct Position, site marked, Risks and benefits discussed,  Surgical consent,  Pre-op evaluation,  At surgeon's request and post-op pain management  Laterality: Right  Prep: chloraprep       Needles:  Injection technique: Single-shot  Needle Type: Echogenic Needle     Needle Length: 9cm      Additional Needles:   Procedures:,,,, ultrasound used (permanent image in chart),,,,  Narrative:  Start time: 05/04/2019 7:12 AM End time: 05/04/2019 7:20 AM Injection made incrementally with aspirations every 5 mL.  Performed by: Personally  Anesthesiologist: Eilene Ghazi, MD  Additional Notes: Patient tolerated the procedure well without complications

## 2019-05-05 ENCOUNTER — Encounter: Payer: Self-pay | Admitting: *Deleted

## 2019-05-05 ENCOUNTER — Other Ambulatory Visit: Payer: Self-pay | Admitting: Surgical

## 2019-05-05 ENCOUNTER — Inpatient Hospital Stay: Payer: BC Managed Care – PPO | Admitting: Orthopedic Surgery

## 2019-05-05 MED ORDER — HYDROCODONE-ACETAMINOPHEN 10-325 MG PO TABS: 1.0000 | ORAL_TABLET | ORAL | 0 refills | Status: DC | PRN

## 2019-05-10 ENCOUNTER — Telehealth: Payer: Self-pay | Admitting: Orthopedic Surgery

## 2019-05-10 NOTE — Telephone Encounter (Signed)
Mr. Alvester Morin from [facility] called requesting verbal orders for patient. Orders are as follow: 3 week 1 and 1 week one. Range of motion to tolerance. Mr. Alvester Morin request to leave detailed message if unavailable. Mr. Alvester Morin phone number is [phone].

## 2019-05-11 NOTE — Telephone Encounter (Signed)
I talked with Dorene Sorrow from [facility] this morning and provided him with information.

## 2019-05-14 ENCOUNTER — Telehealth: Payer: Self-pay | Admitting: Orthopedic Surgery

## 2019-05-14 NOTE — Telephone Encounter (Signed)
Patient called and wanted to know if there was any reason he couldn't drive a car?  Please call patient to advise.  [phone]

## 2019-05-14 NOTE — Telephone Encounter (Signed)
IC discussed with patient.  

## 2019-05-17 ENCOUNTER — Other Ambulatory Visit: Payer: Self-pay

## 2019-05-17 ENCOUNTER — Ambulatory Visit (INDEPENDENT_AMBULATORY_CARE_PROVIDER_SITE_OTHER): Payer: BC Managed Care – PPO | Admitting: Orthopedic Surgery

## 2019-05-17 ENCOUNTER — Ambulatory Visit (INDEPENDENT_AMBULATORY_CARE_PROVIDER_SITE_OTHER): Payer: BC Managed Care – PPO

## 2019-05-17 DIAGNOSIS — Z96611 Presence of right artificial shoulder joint: Secondary | ICD-10-CM

## 2019-05-19 ENCOUNTER — Encounter: Payer: Self-pay | Admitting: Orthopedic Surgery

## 2019-05-19 NOTE — Progress Notes (Signed)
   Post-Op Visit Note   Patient: Greg Munoz           Date of Birth: 05-09-1950           MRN: [id number] Visit Date: 05/17/2019 PCP: Tracey Harries, MD   Assessment & Plan:  Chief Complaint:  Chief Complaint  Patient presents with  . Right Shoulder - Routine Post Op   Visit Diagnoses:  1. Status post reverse total replacement of right shoulder     Plan: Lexie is now 2 weeks out right total shoulder replacement.  CPM is at 23.  On exam deltoid fires incision is intact.  Subscap strength about 4 out of 5.  I will discontinue the sling and have him start physical therapy with Kathlene November upstairs.  Okay to shower.  No lifting with that right arm.  Okay for passive range of motion and active assisted range of motion limiting external rotation to less than or equal to 30 degrees.  Follow-up with me in 3 weeks for clinical recheck.  Follow-Up Instructions: No follow-ups on file.   Orders:  Orders Placed This Encounter  Procedures  . XR Shoulder Right  . Ambulatory referral to Physical Therapy   No orders of the defined types were placed in this encounter.   Imaging: No results found.  PMFS History: Patient Active Problem List   Diagnosis Date Noted  . Sleep apnea in adult 10/20/2015  . Annual physical exam 11/24/2013  . Persistent insomnia 11/23/2012  . Elevated blood pressure reading 08/23/2011  . Body mass index 27.0-27.9, adult 08/16/2011  . Herpes simplex 03/15/2011  . Disorder of external ear 11/27/2009  . 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 Anxiety   . Arthritis    knee  . Depression   . ED (erectile dysfunction)   . HSV infection     History reviewed. No pertinent family history.  Past Surgical History:  Procedure Laterality Date  . COLONOSCOPY  2012, 2018   x 2  . EYE SURGERY Bilateral    lasik  . KNEE SURGERY     arthroscopic knee - pt unsure of which leg  . TOTAL SHOULDER ARTHROPLASTY Right  05/04/2019   Procedure: RIGHT TOTAL SHOULDER ARTHROPLASTY;  Surgeon: Cammy Copa, MD;  Location: [facility] OR;  Service: Orthopedics;  Laterality: Right;  . WISDOM TOOTH EXTRACTION     Social History   Occupational History  . Not on file  Tobacco Use  . Smoking status: Never Smoker  . Smokeless tobacco: Never Used  Substance and Sexual Activity  . Alcohol use: Yes    Alcohol/week: 7.0 - 10.0 standard drinks    Types: 7 - 10 Standard drinks or equivalent per week  . Drug use: Never  . Sexual activity: Yes

## 2019-05-28 ENCOUNTER — Ambulatory Visit (INDEPENDENT_AMBULATORY_CARE_PROVIDER_SITE_OTHER): Payer: BC Managed Care – PPO | Admitting: Physical Therapy

## 2019-05-28 ENCOUNTER — Other Ambulatory Visit: Payer: Self-pay

## 2019-05-28 ENCOUNTER — Encounter: Payer: Self-pay | Admitting: Physical Therapy

## 2019-05-28 DIAGNOSIS — G8929 Other chronic pain: Secondary | ICD-10-CM | POA: Diagnosis not present

## 2019-05-28 DIAGNOSIS — M25611 Stiffness of right shoulder, not elsewhere classified: Secondary | ICD-10-CM | POA: Diagnosis not present

## 2019-05-28 DIAGNOSIS — M25511 Pain in right shoulder: Secondary | ICD-10-CM

## 2019-05-28 DIAGNOSIS — M6281 Muscle weakness (generalized): Secondary | ICD-10-CM

## 2019-05-28 NOTE — Therapy (Signed)
[facility] [address] [place], [place], [address] Phone: [phone]   Fax:  [phone]  Physical Therapy Evaluation  Patient Details  Name: Greg Munoz MRN: [id number] Date of Birth: December 07, 1950 Referring Provider (PT): Cammy Copa MD   Encounter Date: 05/28/2019  PT End of Session - 05/28/19 1509    Visit Number  1    Number of Visits  18    Date for PT Re-Evaluation  07/23/19    Authorization Type  [employer]    PT Start Time  1455   pt arrived 10 min late   PT Stop Time  1534    PT Time Calculation (min)  39 min    Activity Tolerance  Patient tolerated treatment well    Behavior During Therapy  [facility] for tasks assessed/performed       Past Medical History:  Diagnosis Date  . Anxiety   . Arthritis    knee  . Depression   . ED (erectile dysfunction)   . HSV infection     Past Surgical History:  Procedure Laterality Date  . COLONOSCOPY  2012, 2018   x 2  . EYE SURGERY Bilateral    lasik  . KNEE SURGERY     arthroscopic knee - pt unsure of which leg  . TOTAL SHOULDER ARTHROPLASTY Right 05/04/2019   Procedure: RIGHT TOTAL SHOULDER ARTHROPLASTY;  Surgeon: Cammy Copa, MD;  Location: [facility] OR;  Service: Orthopedics;  Laterality: Right;  . WISDOM TOOTH EXTRACTION      There were no vitals filed for this visit.   Subjective Assessment - 05/28/19 1503    Subjective  pt is a 69 y.o m s/p R total shoulder on 05/04/2019. pt has undergone one HHPT and currently using a CPM which is up to 132 today which he was instructed to increased 5 degrees every day. He reports he has been released from using the sling. He continues to have pain inthe shoulder that fluctuates on activity which stays in the [place] with no referral.    Limitations  Lifting   lifting/ pushing/ carrying   How long can you sit comfortably?  unlimited    How long can you stand comfortably?  unlimited    How long can you walk comfortably?  unlimited    Diagnostic tests  x-ray 05/17/2019    Patient Stated Goals  get full ROM, increase stretngth,be able to return to playing basketball, return to playing golf    Currently in Pain?  Yes    Pain Score  4    took pain medication this AM   Pain Location  Shoulder    Pain Orientation  Right;Posterior    Pain Descriptors / Indicators  Aching    Pain Type  Surgical pain    Pain Onset  More than a month ago    Pain Frequency  Constant    Aggravating Factors   unsure    Pain Relieving Factors  pain medication    Effect of Pain on Daily Activities  limited RUE use/ AROM         [facility] PT Assessment - 05/28/19 1511      Assessment   Medical Diagnosis  Status post reverse total replacement of right shoulder Z96.611    Referring Provider (PT)  Cammy Copa MD    Onset Date/Surgical Date  05/04/19    Hand Dominance  Right    Next MD Visit  to make on PRN  Prior Therapy  no      Precautions   Precautions  Shoulder    Precaution Comments  no lifting over a glass of water or a plate      Restrictions   Weight Bearing Restrictions  Yes    RUE Weight Bearing  Non weight bearing      Balance Screen   Has the patient fallen in the past 6 months  No    Has the patient had a decrease in activity level because of a fear of falling?   No    Is the patient reluctant to leave their home because of a fear of falling?   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      Cognition   Overall Cognitive Status  Within Functional Limits for tasks assessed      Posture/Postural Control   Posture/Postural Control  Postural limitations    Postural Limitations  Rounded Shoulders;Forward head      ROM / Strength   AROM / PROM / Strength  AROM;Strength;PROM      AROM   Overall AROM Comments  RUE not assessed due to precaution    AROM Assessment Site  Shoulder    Right/Left Shoulder  Right;Left    Left Shoulder Extension  77 Degrees    Left Shoulder Flexion  152 Degrees    Left Shoulder ABduction  112 Degrees    Left Shoulder Internal Rotation  --   T8   Left Shoulder External Rotation  --   T3     PROM   PROM Assessment Site  Shoulder    Right/Left Shoulder  Right    Right Shoulder Flexion  99 Degrees    Right Shoulder ABduction  68 Degrees    Right Shoulder Internal Rotation  30 Degrees   with shoulder abducted to 45 degrees   Right Shoulder External Rotation  15 Degrees   with shoulder abducted to 45 degrees     Strength   Overall Strength  Due to precautions      Palpation   Palpation comment  TTP along the posterior capsule / posterior deltoid                Objective measurements completed on examination: See above findings.      [facility] Adult PT Treatment/Exercise - 05/28/19 0001      Shoulder Exercises: Supine   Other Supine Exercises  wand flexion 1 x 10 with dowel      Shoulder Exercises: Seated   Retraction  Strengthening;Both;10 reps   scapular retraction     Shoulder Exercises: ROM/Strengthening   Other ROM/Strengthening Exercises  wand ER 1 x 10  cues to avoid going beyond 30 degrees      Manual Therapy   Manual Therapy  Joint mobilization;Passive ROM    Joint Mobilization  grade II-III PA/AP GHJ    Passive ROM  flexion/ abduction working into available ROM             PT Education - 05/28/19 1553    Education  Details  evaluation findings, POC, goals, HEP with proper form/ rationale, anatomy of the area involved. precautions per referral.    Person(s) Educated  Patient    Methods  Explanation;Verbal cues;Handout    Comprehension  Verbalized understanding;Verbal cues required       PT Short Term Goals - 05/28/19 1545      PT SHORT TERM GOAL #1   Title  pt to be I with inital HEP    Time  4    Period  Weeks    Status  New    Target Date  06/25/19      PT SHORT TERM GOAL #2   Title  increase R shoulder AROM flexion/ abduction to >/= 100 degrees for therapuetic progression with </= 4/10    Time  6    Period  Weeks    Status  New    Target Date  07/09/19        PT Long Term Goals - 05/28/19 1546      PT LONG TERM GOAL #1   Title  increase R shoulder flxion/ abduction to >/= 130 degrees and ER/IR to [facility] compared bil with </= 2/10 pain for functional ROM required for ADLs    Time  8    Period  Weeks    Status  New    Target Date  07/23/19      PT LONG TERM GOAL #2   Title  increase R shoulder gross strength to >/= 4+/5 to promote shoulder stability    Time  8    Period  Weeks    Status  New    Target Date  07/23/19      PT LONG TERM GOAL #3   Title  pt to be able to lift and lower >/= 10# to and from an overhead shelf and push/pull >/= 20# for functional strength for ADLs    Time  8    Period  Weeks    Status  New    Target Date  07/23/19      PT LONG TERM GOAL #4   Title  pt to be able to leisurely shoot basketball with no pain or limtatoins per pt's personal goal    Time  8    Period  Weeks    Status  New    Target Date  07/23/19      PT LONG TERM GOAL #5   Title  pt to be I with all HEP given as of last visit to maintain and progress current level of function.    Time  8    Period  Weeks    Status  New    Target Date  07/23/19             Plan - 05/28/19 1534    Clinical Impression Statement  pt presents to OPPT s/p L total shoulder replacement on  05/04/2019. He demonstrates gross R shoulder PROM limtations due to pain/ guarding. AROM and strength testing weren't performed today secondary to precautions. TTP along the posterior capsule. he would benefit from physical therapy to improve shoulder  ROM, strength, return to PLOF by addressing the deficits listed.    Personal Factors and Comorbidities  Comorbidity 1    Comorbidities  hx of anxiety    Stability/Clinical Decision Making  Stable/Uncomplicated    Clinical Decision Making  Low    Rehab Potential  Good    PT Frequency  3x / week   3 x a week for 2 weeks then dropping down to 2 x a week   PT Duration  8 weeks    PT Treatment/Interventions  ADLs/Self Care Home Management;Cryotherapy;Electrical Stimulation;Iontophoresis 4mg /ml Dexamethasone;Moist Heat;Ultrasound;Therapeutic activities;Therapeutic exercise;Balance training;Neuromuscular re-education;Patient/family education;Manual techniques;Passive range of motion;Taping;Vasopneumatic Device    PT Next Visit Plan  review HEP/ update PRN, shoulder mobs, and AAROM, scapular setting, precautions to avoid 30 degrees of ER,    PT Home Exercise Plan  [id number] - table slides for flexion/ abuction, scapular retraction, wand ER and supine wand AAROM flexion    Consulted and Agree with Plan of Care  Patient       Patient will benefit from skilled therapeutic intervention in order to improve the following deficits and impairments:  Pain, Improper body mechanics, Increased muscle spasms, Decreased strength, Postural dysfunction, Impaired UE functional use, Decreased activity tolerance, Decreased endurance  Visit Diagnosis: Chronic right shoulder pain  Stiffness of right shoulder, not elsewhere classified  Muscle weakness (generalized)     Problem List Patient Active Problem List   Diagnosis Date Noted  . Sleep apnea in adult 10/20/2015  . Annual physical exam 11/24/2013  . Persistent insomnia 11/23/2012  . Elevated blood pressure  reading 08/23/2011  . Body mass index 27.0-27.9, adult 08/16/2011  . Herpes simplex 03/15/2011  . Disorder of external ear 11/27/2009  . 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 [place], [place], [address] Phone: [phone]   Fax:  [phone]  Name: Greg Munoz MRN: [id number] Date of Birth: 23-Dec-1950

## 2019-06-01 ENCOUNTER — Encounter: Payer: BC Managed Care – PPO | Admitting: Rehabilitative and Restorative Service Providers"

## 2019-06-01 ENCOUNTER — Other Ambulatory Visit: Payer: Self-pay | Admitting: Surgical

## 2019-06-01 ENCOUNTER — Telehealth: Payer: Self-pay | Admitting: Rehabilitative and Restorative Service Providers"

## 2019-06-01 ENCOUNTER — Telehealth: Payer: Self-pay | Admitting: Orthopedic Surgery

## 2019-06-01 MED ORDER — HYDROCODONE-ACETAMINOPHEN 5-325 MG PO TABS: 1.0000 | ORAL_TABLET | Freq: Three times a day (TID) | ORAL | 0 refills | Status: DC | PRN

## 2019-06-01 NOTE — Telephone Encounter (Signed)
Pt called in asking for a refill of his hydrocodone medication be sent to [facility] on [address].  [phone]

## 2019-06-01 NOTE — Telephone Encounter (Signed)
Pls advise. Thanks.  

## 2019-06-01 NOTE — Telephone Encounter (Signed)
Called Pt. About no show for appointment.  Message left for patient on answering machine.  Chyrel Masson, PT, DPT, OCS, ATC 06/01/19  10:36 AM

## 2019-06-01 NOTE — Telephone Encounter (Signed)
Tried calling patient to advise. No answer.  

## 2019-06-08 ENCOUNTER — Other Ambulatory Visit: Payer: Self-pay

## 2019-06-08 ENCOUNTER — Ambulatory Visit: Payer: BC Managed Care – PPO | Admitting: Rehabilitative and Restorative Service Providers"

## 2019-06-08 ENCOUNTER — Encounter: Payer: Self-pay | Admitting: Rehabilitative and Restorative Service Providers"

## 2019-06-08 DIAGNOSIS — G8929 Other chronic pain: Secondary | ICD-10-CM

## 2019-06-08 DIAGNOSIS — M25611 Stiffness of right shoulder, not elsewhere classified: Secondary | ICD-10-CM | POA: Diagnosis not present

## 2019-06-08 DIAGNOSIS — M25511 Pain in right shoulder: Secondary | ICD-10-CM | POA: Diagnosis not present

## 2019-06-08 DIAGNOSIS — M6281 Muscle weakness (generalized): Secondary | ICD-10-CM

## 2019-06-08 NOTE — Therapy (Signed)
[facility] Therapy [address] [place], [place], [address] Phone: [phone]   Fax:  [phone]  Physical Therapy Treatment  Patient Details  Name: Greg Munoz MRN: [id number] Date of Birth: Dec 15, 1950 Referring Provider (PT): Cammy Copa MD   Encounter Date: 06/08/2019  PT End of Session - 06/08/19 1349    Visit Number  2    Number of Visits  18    Date for PT Re-Evaluation  07/23/19    Authorization Type  BCBS    PT Start Time  1313    PT Stop Time  1353    PT Time Calculation (min)  40 min    Activity Tolerance  Patient tolerated treatment well    Behavior During Therapy  [facility] for tasks assessed/performed       Past Medical History:  Diagnosis Date  . Anxiety   . Arthritis    knee  . Depression   . ED (erectile dysfunction)   . HSV infection     Past Surgical History:  Procedure Laterality Date  . COLONOSCOPY  2012, 2018   x 2  . EYE SURGERY Bilateral    lasik  . KNEE SURGERY     arthroscopic knee - pt unsure of which leg  . 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 WISDOM TOOTH EXTRACTION      There were no vitals filed for this visit.  Subjective Assessment - 06/08/19 1327    Subjective  Pt. indicated doing HEP sometimes more than others. Some tightness and pain at end range.  no pain at rest.    Limitations  Lifting   lifting/ pushing/ carrying   How long can you sit comfortably?  unlimited    How long can you stand comfortably?  unlimited    How long can you walk comfortably?  unlimited    Diagnostic tests  x-ray 05/17/2019    Patient Stated Goals  get full ROM, increase stretngth,be able to return to playing basketball, return to playing golf    Currently in Pain?  No/denies    Pain Score  0-No pain    Pain Location  Shoulder    Pain Orientation  Right    Pain  Onset  More than a month ago                       [facility] Adult PT Treatment/Exercise - 06/08/19 0001      Shoulder Exercises: Supine   Other Supine Exercises  supine wand flexion 2 x 10, supine wand abd 2 x 10      Shoulder Exercises: Standing   Extension  Strengthening;Both   3 x 10   Theraband Level (Shoulder Extension)  Level 3 (Green)    Barrister's clerk;Both   3 x 10   Theraband Level (Shoulder Row)  Level 3 (Green)      Shoulder Exercises: ROM/Strengthening   UBE (Upper Arm Bike)  L1 3 mins fwd/rev each way      Manual Therapy   Joint Mobilization  g2-g3 inferior, AP             PT Education - 06/08/19 1328    Education Details  Cues for intervention at times.    Person(s) Educated  Patient    Methods  Explanation;Demonstration    Comprehension  Verbalized understanding  PT Short Term Goals - 05/28/19 1545      PT SHORT TERM GOAL #1   Title  pt to be I with inital HEP    Time  4    Period  Weeks    Status  New    Target Date  06/25/19      PT SHORT TERM GOAL #2   Title  increase R shoulder AROM flexion/ abduction to >/= 100 degrees for therapuetic progression with </= 4/10    Time  6    Period  Weeks    Status  New    Target Date  07/09/19        PT Long Term Goals - 05/28/19 1546      PT LONG TERM GOAL #1   Title  increase R shoulder flxion/ abduction to >/= 130 degrees and ER/IR to [facility] compared bil with </= 2/10 pain for functional ROM required for ADLs    Time  8    Period  Weeks    Status  New    Target Date  07/23/19      PT LONG TERM GOAL #2   Title  increase R shoulder gross strength to >/= 4+/5 to promote shoulder stability    Time  8    Period  Weeks    Status  New    Target Date  07/23/19      PT LONG TERM GOAL #3   Title  pt to be able to lift and lower >/= 10# to and from an overhead shelf and push/pull >/= 20# for functional strength for ADLs    Time  8    Period  Weeks    Status  New    Target  Date  07/23/19      PT LONG TERM GOAL #4   Title  pt to be able to leisurely shoot basketball with no pain or limtatoins per pt's personal goal    Time  8    Period  Weeks    Status  New    Target Date  07/23/19      PT LONG TERM GOAL #5   Title  pt to be I with all HEP given as of last visit to maintain and progress current level of function.    Time  8    Period  Weeks    Status  New    Target Date  07/23/19            Plan - 06/08/19 1347    Clinical Impression Statement  Pt. making progress at this time in mobility as appropriate.  Mild joint restriction noted in ap movements, inferior movement.   Continued skilled PT services indicated at this time.    Personal Factors and Comorbidities  Comorbidity 1    Comorbidities  hx of anxiety    Stability/Clinical Decision Making  Stable/Uncomplicated    Rehab Potential  Good    PT Frequency  3x / week   3 x a week for 2 weeks then dropping down to 2 x a week   PT Duration  8 weeks    PT Treatment/Interventions  ADLs/Self Care Home Management;Cryotherapy;Electrical Stimulation;Iontophoresis 4mg /ml Dexamethasone;Moist Heat;Ultrasound;Therapeutic activities;Therapeutic exercise;Balance training;Neuromuscular re-education;Patient/family education;Manual techniques;Passive range of motion;Taping;Vasopneumatic Device    PT Next Visit Plan  Continue to progress mobility.  Strengthening starts around 06/28/2019.    PT Home Exercise Plan  [id number] - table slides for flexion/ abuction, scapular retraction, wand ER and supine wand AAROM flexion  Consulted and Agree with Plan of Care  Patient       Patient will benefit from skilled therapeutic intervention in order to improve the following deficits and impairments:  Pain, Improper body mechanics, Increased muscle spasms, Decreased strength, Postural dysfunction, Impaired UE functional use, Decreased activity tolerance, Decreased endurance  Visit Diagnosis: Chronic right shoulder  pain  Stiffness of right shoulder, not elsewhere classified  Muscle weakness (generalized)     Problem List Patient Active Problem List   Diagnosis Date Noted  . Sleep apnea in adult 10/20/2015  . Annual physical exam 11/24/2013  . Persistent insomnia 11/23/2012  . Elevated blood pressure reading 08/23/2011  . Body mass index 27.0-27.9, adult 08/16/2011  . Herpes simplex 03/15/2011  . Disorder of external ear 11/27/2009  . ED (erectile dysfunction) of organic origin 08/19/2008    Scot Jun, PT, DPT, OCS, ATC 06/08/19  1:57 PM    [facility] [address] [place], [place], [address] Phone: [phone]   Fax:  [phone]  Name: Greg Munoz MRN: [id number] Date of Birth: 1950/04/23

## 2019-06-10 ENCOUNTER — Other Ambulatory Visit: Payer: Self-pay

## 2019-06-10 ENCOUNTER — Ambulatory Visit (INDEPENDENT_AMBULATORY_CARE_PROVIDER_SITE_OTHER): Payer: BC Managed Care – PPO | Admitting: Rehabilitative and Restorative Service Providers"

## 2019-06-10 ENCOUNTER — Encounter: Payer: Self-pay | Admitting: Rehabilitative and Restorative Service Providers"

## 2019-06-10 DIAGNOSIS — M25611 Stiffness of right shoulder, not elsewhere classified: Secondary | ICD-10-CM | POA: Diagnosis not present

## 2019-06-10 DIAGNOSIS — M25511 Pain in right shoulder: Secondary | ICD-10-CM | POA: Diagnosis not present

## 2019-06-10 DIAGNOSIS — G8929 Other chronic pain: Secondary | ICD-10-CM | POA: Diagnosis not present

## 2019-06-10 DIAGNOSIS — M6281 Muscle weakness (generalized): Secondary | ICD-10-CM | POA: Diagnosis not present

## 2019-06-10 NOTE — Therapy (Signed)
[facility] Physical Therapy [address] [place], [place], [address] Phone: [phone]   Fax:  [phone]  Physical Therapy Treatment  Patient Details  Name: Greg Munoz MRN: [id number] Date of Birth: [birth year] Referring Provider (PT): Cammy Copa MD   Encounter Date: 06/10/2019  PT End of Session - 06/10/19 1401    Visit Number  3    Number of Visits  18    Date for PT Re-Evaluation  07/23/19    Authorization Type  BCBS    PT Start Time  1400    PT Stop Time  1445    PT Time Calculation (min)  45 min    Activity Tolerance  Patient tolerated treatment well    Behavior During Therapy  [facility] for tasks assessed/performed       Past Medical History:  Diagnosis Date  . Anxiety   . Arthritis    knee  . Depression   . ED (erectile dysfunction)   . HSV infection     Past Surgical History:  Procedure Laterality Date  . COLONOSCOPY  2012, 2018   x 2  . EYE SURGERY Bilateral    lasik  . KNEE SURGERY     arthroscopic knee - pt unsure of which leg  . 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 WISDOM TOOTH EXTRACTION      There were no vitals filed for this visit.  Subjective Assessment - 06/10/19 1402    Subjective  Pt. stated no pain complaints upon arrival today.  No specific changes noted.    Limitations  Lifting   lifting/ pushing/ carrying   How long can you sit comfortably?  unlimited    How long can you stand comfortably?  unlimited    How long can you walk comfortably?  unlimited    Diagnostic tests  x-ray 05/17/2019    Patient Stated Goals  get full ROM, increase stretngth,be able to return to playing basketball, return to playing golf    Currently in Pain?  No/denies    Pain Score  0-No pain    Pain Location  Shoulder    Pain Orientation  Right    Pain Onset  More than a month ago                        [facility] Adult PT Treatment/Exercise - 06/10/19 0001      Shoulder Exercises: Supine   Other Supine Exercises  supine wand 5 second hold x 20, supine abd 20 x      Shoulder Exercises: Standing   Extension  Strengthening;Both    Theraband Level (Shoulder Extension)  Level 4 (Blue)    Barrister's clerk;Both    Theraband Level (Shoulder Row)  Level 4 (Blue)      Shoulder Exercises: ROM/Strengthening   UBE (Upper Arm Bike)  L1 3 mins fwd/rev each way      Manual Therapy   Joint Mobilization  g3-g4 inferior, ap Rt gh joint               PT Short Term Goals - 05/28/19 1545      PT SHORT TERM GOAL #1   Title  pt to be I with inital HEP    Time  4    Period  Weeks    Status  New  Target Date  06/25/19      PT SHORT TERM GOAL #2   Title  increase R shoulder AROM flexion/ abduction to >/= 100 degrees for therapuetic progression with </= 4/10    Time  6    Period  Weeks    Status  New    Target Date  07/09/19        PT Long Term Goals - 05/28/19 1546      PT LONG TERM GOAL #1   Title  increase R shoulder flxion/ abduction to >/= 130 degrees and ER/IR to [facility] compared bil with </= 2/10 pain for functional ROM required for ADLs    Time  8    Period  Weeks    Status  New    Target Date  07/23/19      PT LONG TERM GOAL #2   Title  increase R shoulder gross strength to >/= 4+/5 to promote shoulder stability    Time  8    Period  Weeks    Status  New    Target Date  07/23/19      PT LONG TERM GOAL #3   Title  pt to be able to lift and lower >/= 10# to and from an overhead shelf and push/pull >/= 20# for functional strength for ADLs    Time  8    Period  Weeks    Status  New    Target Date  07/23/19      PT LONG TERM GOAL #4   Title  pt to be able to leisurely shoot basketball with no pain or limtatoins per pt's personal goal    Time  8    Period  Weeks    Status  New    Target Date  07/23/19      PT LONG TERM GOAL #5    Title  pt to be I with all HEP given as of last visit to maintain and progress current level of function.    Time  8    Period  Weeks    Status  New    Target Date  07/23/19            Plan - 06/10/19 1455    Clinical Impression Statement  Pt. mobility continues to improve overall at this time.  Abduction limited more than flexion at this time.    Personal Factors and Comorbidities  Comorbidity 1    Comorbidities  hx of anxiety    Stability/Clinical Decision Making  Stable/Uncomplicated    Rehab Potential  Good    PT Frequency  3x / week   3 x a week for 2 weeks then dropping down to 2 x a week   PT Duration  8 weeks    PT Treatment/Interventions  ADLs/Self Care Home Management;Cryotherapy;Electrical Stimulation;Iontophoresis 4mg /ml Dexamethasone;Moist Heat;Ultrasound;Therapeutic activities;Therapeutic exercise;Balance training;Neuromuscular re-education;Patient/family education;Manual techniques;Passive range of motion;Taping;Vasopneumatic Device    PT Next Visit Plan  Continue to progress mobility.    PT Home Exercise Plan  [id number] - table slides for flexion/ abuction, scapular retraction, wand ER and supine wand AAROM flexion    Consulted and Agree with Plan of Care  Patient       Patient will benefit from skilled therapeutic intervention in order to improve the following deficits and impairments:  Pain, Improper body mechanics, Increased muscle spasms, Decreased strength, Postural dysfunction, Impaired UE functional use, Decreased activity tolerance, Decreased endurance  Visit Diagnosis: Chronic right shoulder pain  Stiffness of right  shoulder, not elsewhere classified  Muscle weakness (generalized)     Problem List Patient Active Problem List   Diagnosis Date Noted  . Sleep apnea in adult 10/20/2015  . Annual physical exam 11/24/2013  . Persistent insomnia 11/23/2012  . Elevated blood pressure reading 08/23/2011  . Body mass index 27.0-27.9, adult 08/16/2011  .  Herpes simplex 03/15/2011  . Disorder of external ear 11/27/2009  . ED (erectile dysfunction) of organic origin 08/19/2008    Scot Jun, PT, DPT, OCS, ATC 06/10/19  2:56 PM    [facility] Physical Therapy [address] [place], [place], [address] Phone: [phone]   Fax:  [phone]  Name: Greg Munoz MRN: [id number] Date of Birth: 09-Feb-1951

## 2019-06-11 ENCOUNTER — Encounter: Payer: BC Managed Care – PPO | Admitting: Rehabilitative and Restorative Service Providers"

## 2019-06-11 ENCOUNTER — Ambulatory Visit (INDEPENDENT_AMBULATORY_CARE_PROVIDER_SITE_OTHER): Payer: BC Managed Care – PPO | Admitting: Rehabilitative and Restorative Service Providers"

## 2019-06-11 ENCOUNTER — Encounter: Payer: Self-pay | Admitting: Rehabilitative and Restorative Service Providers"

## 2019-06-11 DIAGNOSIS — G8929 Other chronic pain: Secondary | ICD-10-CM | POA: Diagnosis not present

## 2019-06-11 DIAGNOSIS — M6281 Muscle weakness (generalized): Secondary | ICD-10-CM | POA: Diagnosis not present

## 2019-06-11 DIAGNOSIS — M25511 Pain in right shoulder: Secondary | ICD-10-CM

## 2019-06-11 DIAGNOSIS — M25611 Stiffness of right shoulder, not elsewhere classified: Secondary | ICD-10-CM | POA: Diagnosis not present

## 2019-06-11 NOTE — Therapy (Signed)
[facility] Physical Therapy [address] [place], [place], [address] Phone: [phone]   Fax:  [phone]  Physical Therapy Treatment  Patient Details  Name: Greg Munoz MRN: [id number] Date of Birth: 03-Oct-1950 Referring Provider (PT): Cammy Copa MD   Encounter Date: 06/11/2019  PT End of Session - 06/11/19 1141    Visit Number  4    Number of Visits  18    Date for PT Re-Evaluation  07/23/19    Authorization Type  BCBS    PT Start Time  1120    PT Stop Time  1159    PT Time Calculation (min)  39 min    Activity Tolerance  Patient tolerated treatment well    Behavior During Therapy  [facility] for tasks assessed/performed       Past Medical History:  Diagnosis Date  . Anxiety   . Arthritis    knee  . Depression   . ED (erectile dysfunction)   . HSV infection     Past Surgical History:  Procedure Laterality Date  . COLONOSCOPY  2012, 2018   x 2  . EYE SURGERY Bilateral    lasik  . KNEE SURGERY     arthroscopic knee - pt unsure of which leg  . 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 WISDOM TOOTH EXTRACTION      There were no vitals filed for this visit.  Subjective Assessment - 06/11/19 1138    Subjective  Pt. indicated feeling no complaints since last visit.    Limitations  Lifting   lifting/ pushing/ carrying   How long can you sit comfortably?  unlimited    How long can you stand comfortably?  unlimited    How long can you walk comfortably?  unlimited    Diagnostic tests  x-ray 05/17/2019    Patient Stated Goals  get full ROM, increase stretngth,be able to return to playing basketball, return to playing golf    Currently in Pain?  No/denies    Pain Score  0-No pain    Pain Orientation  Right    Pain Descriptors / Indicators  Aching    Pain Type  Surgical pain    Pain Onset  More  than a month ago                       [facility] Adult PT Treatment/Exercise - 06/11/19 0001      Shoulder Exercises: Supine   Other Supine Exercises  supine wand 5 second hold x 20, supine abd 20 x    Other Supine Exercises  supine 90 deg flexion steady hold 2 mins      Shoulder Exercises: Standing   Other Standing Exercises  abd, flexion isometric 5 second hold x 10      Shoulder Exercises: ROM/Strengthening   UBE (Upper Arm Bike)  lvl 1.5 4 mins each way       Manual Therapy   Joint Mobilization  g3-g4 inferior, ap Rt gh joint             PT Education - 06/11/19 1139    Education Details  Intervention cues at times    Person(s) Educated  Patient    Methods  Explanation;Demonstration       PT Short Term Goals - 05/28/19 1545      PT SHORT  TERM GOAL #1   Title  pt to be I with inital HEP    Time  4    Period  Weeks    Status  New    Target Date  06/25/19      PT SHORT TERM GOAL #2   Title  increase R shoulder AROM flexion/ abduction to >/= 100 degrees for therapuetic progression with </= 4/10    Time  6    Period  Weeks    Status  New    Target Date  07/09/19        PT Long Term Goals - 05/28/19 1546      PT LONG TERM GOAL #1   Title  increase R shoulder flxion/ abduction to >/= 130 degrees and ER/IR to [facility] compared bil with </= 2/10 pain for functional ROM required for ADLs    Time  8    Period  Weeks    Status  New    Target Date  07/23/19      PT LONG TERM GOAL #2   Title  increase R shoulder gross strength to >/= 4+/5 to promote shoulder stability    Time  8    Period  Weeks    Status  New    Target Date  07/23/19      PT LONG TERM GOAL #3   Title  pt to be able to lift and lower >/= 10# to and from an overhead shelf and push/pull >/= 20# for functional strength for ADLs    Time  8    Period  Weeks    Status  New    Target Date  07/23/19      PT LONG TERM GOAL #4   Title  pt to be able to leisurely shoot basketball with no  pain or limtatoins per pt's personal goal    Time  8    Period  Weeks    Status  New    Target Date  07/23/19      PT LONG TERM GOAL #5   Title  pt to be I with all HEP given as of last visit to maintain and progress current level of function.    Time  8    Period  Weeks    Status  New    Target Date  07/23/19            Plan - 06/11/19 1140    Clinical Impression Statement  Progressing well in mobility intervention at this time, preparing nicely for transition to AROM, strengthening in next week or two.    Personal Factors and Comorbidities  Comorbidity 1    Comorbidities  hx of anxiety    Stability/Clinical Decision Making  Stable/Uncomplicated    Rehab Potential  Good    PT Frequency  3x / week   3 x a week for 2 weeks then dropping down to 2 x a week   PT Duration  8 weeks    PT Treatment/Interventions  ADLs/Self Care Home Management;Cryotherapy;Electrical Stimulation;Iontophoresis 4mg /ml Dexamethasone;Moist Heat;Ultrasound;Therapeutic activities;Therapeutic exercise;Balance training;Neuromuscular re-education;Patient/family education;Manual techniques;Passive range of motion;Taping;Vasopneumatic Device    PT Next Visit Plan  Continue to progress mobility.    PT Home Exercise Plan  [email or web address] - table slides for flexion/ abuction, scapular retraction, wand ER and supine wand AAROM flexion    Consulted and Agree with Plan of Care  Patient       Patient will benefit from skilled therapeutic intervention in order to improve  the following deficits and impairments:  Pain, Improper body mechanics, Increased muscle spasms, Decreased strength, Postural dysfunction, Impaired UE functional use, Decreased activity tolerance, Decreased endurance  Visit Diagnosis: Chronic right shoulder pain  Stiffness of right shoulder, not elsewhere classified  Muscle weakness (generalized)     Problem List Patient Active Problem List   Diagnosis Date Noted  . Sleep apnea in adult 10/20/2015   . Annual physical exam 11/24/2013  . Persistent insomnia 11/23/2012  . Elevated blood pressure reading 08/23/2011  . Body mass index 27.0-27.9, adult 08/16/2011  . Herpes simplex 03/15/2011  . Disorder of external ear 11/27/2009  . ED (erectile dysfunction) of organic origin 08/19/2008   Scot Jun, PT, DPT, OCS, ATC 06/11/19  11:55 AM    [facility] Physical Therapy [address] [place], [place], [address] Phone: [phone]   Fax:  [phone]  Name: RAYSEAN GRAUMANN MRN: [id number] Date of Birth: [birth year]

## 2019-06-19 ENCOUNTER — Ambulatory Visit: Payer: BC Managed Care – PPO | Attending: Internal Medicine

## 2019-06-19 DIAGNOSIS — Z23 Encounter for immunization: Secondary | ICD-10-CM

## 2019-06-19 NOTE — Progress Notes (Signed)
   Covid-19 Vaccination Clinic  Name:  GARRUS GAUTHREAUX    MRN: [id number] DOB: 03-11-1951  06/19/2019  Mr. Semper was observed post Covid-19 immunization for 15 minutes without incident. He was provided with Vaccine Information Sheet and instruction to access the V-Safe system.   Mr. Leifheit was instructed to call 911 with any severe reactions post vaccine: Marland Kitchen Difficulty breathing  . Swelling of face and throat  . A fast heartbeat  . A bad rash all over body  . Dizziness and weakness   Immunizations Administered    Name Date Dose VIS Date Route   Pfizer COVID-19 Vaccine 06/19/2019  9:44 AM 0.3 mL 02/26/2019 Intramuscular   Manufacturer: [employer], [employer]   Lot: [id number]   NDC: [address]-3

## 2019-06-21 ENCOUNTER — Encounter: Payer: BC Managed Care – PPO | Admitting: Rehabilitative and Restorative Service Providers"

## 2019-06-21 ENCOUNTER — Telehealth: Payer: Self-pay | Admitting: Rehabilitative and Restorative Service Providers"

## 2019-06-21 NOTE — Telephone Encounter (Signed)
Called Pt. After 15 mins no show for 11:45 appointment - left message c request to call back to possibly reschedule appointment for this week.  Chyrel Masson, PT, DPT, OCS, ATC 06/21/19  12:02 PM

## 2019-06-22 ENCOUNTER — Encounter: Payer: BC Managed Care – PPO | Admitting: Rehabilitative and Restorative Service Providers"

## 2019-06-24 ENCOUNTER — Telehealth: Payer: Self-pay | Admitting: Rehabilitative and Restorative Service Providers"

## 2019-06-24 ENCOUNTER — Encounter: Payer: BC Managed Care – PPO | Admitting: Rehabilitative and Restorative Service Providers"

## 2019-06-24 NOTE — Telephone Encounter (Signed)
Called Pt. To follow up on no show for appointment after 15 mins.  Pt. Did not answer.  Left message.  Pt. Has no showed for 2 straight appointments.  In line with policy, Pt. Was instructed on message that his appointments in future would be cancelled until he called to schedule a return appointment.   Chyrel Masson, PT, DPT, OCS, ATC 06/24/19  1:30 PM

## 2019-06-28 ENCOUNTER — Other Ambulatory Visit: Payer: Self-pay

## 2019-06-28 ENCOUNTER — Encounter: Payer: BC Managed Care – PPO | Admitting: Rehabilitative and Restorative Service Providers"

## 2019-06-28 ENCOUNTER — Ambulatory Visit (INDEPENDENT_AMBULATORY_CARE_PROVIDER_SITE_OTHER): Payer: BC Managed Care – PPO | Admitting: Rehabilitative and Restorative Service Providers"

## 2019-06-28 ENCOUNTER — Encounter: Payer: BC Managed Care – PPO | Admitting: Physical Therapy

## 2019-06-28 ENCOUNTER — Encounter: Payer: Self-pay | Admitting: Rehabilitative and Restorative Service Providers"

## 2019-06-28 DIAGNOSIS — M25511 Pain in right shoulder: Secondary | ICD-10-CM | POA: Diagnosis not present

## 2019-06-28 DIAGNOSIS — M6281 Muscle weakness (generalized): Secondary | ICD-10-CM

## 2019-06-28 DIAGNOSIS — G8929 Other chronic pain: Secondary | ICD-10-CM

## 2019-06-28 DIAGNOSIS — M25611 Stiffness of right shoulder, not elsewhere classified: Secondary | ICD-10-CM | POA: Diagnosis not present

## 2019-06-28 NOTE — Patient Instructions (Signed)
Access Code: [email or web address] URL: [email or web address] Date: 06/28/2019 Prepared by: Chyrel Masson  Exercises Isometric Shoulder Flexion at Wall - 2 x daily - 7 x weekly - 1 sets - 10 reps - 5 hold Isometric Shoulder Abduction at Wall - 2 x daily - 7 x weekly - 1 sets - 10 reps - 5 hold Isometric Shoulder External Rotation at Wall - 2 x daily - 7 x weekly - 1 sets - 10 reps - 5 hold Standing Isometric Shoulder Internal Rotation at Doorway - 1 x daily - 7 x weekly - 10 reps - 3 sets Standing Single Arm Shoulder Flexion Stretch on Wall - 2 x daily - 7 x weekly - 2 sets - 10 reps - 5 hold Standing Shoulder External Rotation Stretch in Doorway - 1-2 x daily - 7 x weekly - 1 sets - 3-5 reps - 15-30 hold Standing Shoulder Row with Anchored Resistance - 1 x daily - 7 x weekly - 3 sets - 10 reps Shoulder Extension with Resistance - 1 x daily - 7 x weekly - 3 sets - 10 reps

## 2019-06-28 NOTE — Therapy (Signed)
[facility] Physical Therapy [address] [place], [place], [address] Phone: [phone]   Fax:  [phone]  Physical Therapy Treatment  Patient Details  Name: Greg Munoz MRN: [id number] Date of Birth: 06/27/1950 Referring Provider (PT): Meredith Pel MD   Encounter Date: 06/28/2019  PT End of Session - 06/28/19 1319    Visit Number  5    Number of Visits  18    Date for PT Re-Evaluation  07/23/19    Authorization Type  BCBS    PT Start Time  1316    PT Stop Time  1355    PT Time Calculation (min)  39 min    Activity Tolerance  Patient tolerated treatment well    Behavior During Therapy  [facility] for tasks assessed/performed       Past Medical History:  Diagnosis Date  . Anxiety   . Arthritis    knee  . Depression   . ED (erectile dysfunction)   . HSV infection     Past Surgical History:  Procedure Laterality Date  . COLONOSCOPY  2012, 2018   x 2  . EYE SURGERY Bilateral    lasik  . KNEE SURGERY     arthroscopic knee - pt unsure of which leg  . 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 WISDOM TOOTH EXTRACTION      There were no vitals filed for this visit.  Subjective Assessment - 06/28/19 1338    Subjective  Pt. stated no particular complaints overall of pain increases, just tight and stiff at times.  Pt. indicated he may have not performed HEP as instructed each day.    Limitations  Lifting   lifting/ pushing/ carrying   How long can you sit comfortably?  unlimited    How long can you stand comfortably?  unlimited    How long can you walk comfortably?  unlimited    Diagnostic tests  x-ray 05/17/2019    Patient Stated Goals  get full ROM, increase stretngth,be able to return to playing basketball, return to playing golf    Currently in Pain?  No/denies    Pain Score  0-No pain    Pain  Location  Shoulder    Pain Orientation  Right    Pain Descriptors / Indicators  Aching    Pain Onset  More than a month ago    Aggravating Factors   lifting, end range movement.                       [place] Adult PT Treatment/Exercise - 06/28/19 0001      Shoulder Exercises: Sidelying   External Rotation  AROM;Strengthening;Right   4 x 10 c active compression manual technique     Shoulder Exercises: Standing   Other Standing Exercises  flexion, er, abd isometric 5 seconds x 10 each    Other Standing Exercises  wall flexion c step 3 x 10 c arm ranger      Manual Therapy   Joint Mobilization  g3-g4 inferior jt mobs, active compression c stretch levator Rt, infraspinatus Rt, lat Rt, subscap Rt             PT Education - 06/28/19 1340    Education Details  Cues for intervention, HEP adherence and new activity.    Person(s) Educated  Patient  Methods  Explanation;Demonstration    Comprehension  Verbalized understanding       PT Short Term Goals - 05/28/19 1545      PT SHORT TERM GOAL #1   Title  pt to be I with inital HEP    Time  4    Period  Weeks    Status  New    Target Date  06/25/19      PT SHORT TERM GOAL #2   Title  increase R shoulder AROM flexion/ abduction to >/= 100 degrees for therapuetic progression with </= 4/10    Time  6    Period  Weeks    Status  New    Target Date  07/09/19        PT Long Term Goals - 05/28/19 1546      PT LONG TERM GOAL #1   Title  increase R shoulder flxion/ abduction to >/= 130 degrees and ER/IR to [facility] compared bil with </= 2/10 pain for functional ROM required for ADLs    Time  8    Period  Weeks    Status  New    Target Date  07/23/19      PT LONG TERM GOAL #2   Title  increase R shoulder gross strength to >/= 4+/5 to promote shoulder stability    Time  8    Period  Weeks    Status  New    Target Date  07/23/19      PT LONG TERM GOAL #3   Title  pt to be able to lift and lower >/= 10# to and  from an overhead shelf and push/pull >/= 20# for functional strength for ADLs    Time  8    Period  Weeks    Status  New    Target Date  07/23/19      PT LONG TERM GOAL #4   Title  pt to be able to leisurely shoot basketball with no pain or limtatoins per pt's personal goal    Time  8    Period  Weeks    Status  New    Target Date  07/23/19      PT LONG TERM GOAL #5   Title  pt to be I with all HEP given as of last visit to maintain and progress current level of function.    Time  8    Period  Weeks    Status  New    Target Date  07/23/19            Plan - 06/28/19 1347    Clinical Impression Statement  Progress at this time impacted in part due to inconsistent attendance to therapy as POC stated from the originial evaluation.  Pt. presented today c end range limitations approx. 75% WFL passively for flexion, scaption movement.  Myofascial restriction as well as joint capsule restriction evident today.  Progressed to early strengthening within available range with minimal pain reports.    Personal Factors and Comorbidities  Comorbidity 1    Comorbidities  hx of anxiety    Stability/Clinical Decision Making  Stable/Uncomplicated    Rehab Potential  Good    PT Frequency  3x / week   3 x a week for 2 weeks then dropping down to 2 x a week   PT Duration  8 weeks    PT Treatment/Interventions  ADLs/Self Care Home Management;Cryotherapy;Electrical Stimulation;Iontophoresis 4mg /ml Dexamethasone;Moist Heat;Ultrasound;Therapeutic activities;Therapeutic exercise;Balance training;Neuromuscular re-education;Patient/family education;Manual techniques;Passive  range of motion;Taping;Vasopneumatic Device    PT Next Visit Plan  Encourage adherence/compliance, progress mobilty/strength.    PT Home Exercise Plan  [email or web address]    Consulted and Agree with Plan of Care  Patient       Patient will benefit from skilled therapeutic intervention in order to improve the following deficits and  impairments:  Pain, Improper body mechanics, Increased muscle spasms, Decreased strength, Postural dysfunction, Impaired UE functional use, Decreased activity tolerance, Decreased endurance  Visit Diagnosis: Chronic right shoulder pain  Stiffness of right shoulder, not elsewhere classified  Muscle weakness (generalized)     Problem List Patient Active Problem List   Diagnosis Date Noted  . Sleep apnea in adult 10/20/2015  . Annual physical exam 11/24/2013  . Persistent insomnia 11/23/2012  . Elevated blood pressure reading 08/23/2011  . Body mass index 27.0-27.9, adult 08/16/2011  . Herpes simplex 03/15/2011  . Disorder of external ear 11/27/2009  . ED (erectile dysfunction) of organic origin 08/19/2008   Chyrel Masson, PT, DPT, OCS, ATC 06/28/19  2:28 PM     [facility] Physical Therapy [address] [place], [place], [address] Phone: [phone]   Fax:  [phone]  Name: Greg Munoz MRN: [id number] Date of Birth: August 19, 1950

## 2019-06-30 ENCOUNTER — Encounter: Payer: BC Managed Care – PPO | Admitting: Rehabilitative and Restorative Service Providers"

## 2019-07-05 ENCOUNTER — Encounter: Payer: BC Managed Care – PPO | Admitting: Rehabilitative and Restorative Service Providers"

## 2019-07-07 ENCOUNTER — Telehealth: Payer: Self-pay | Admitting: Orthopedic Surgery

## 2019-07-07 ENCOUNTER — Other Ambulatory Visit: Payer: Self-pay

## 2019-07-07 ENCOUNTER — Encounter: Payer: Self-pay | Admitting: Rehabilitative and Restorative Service Providers"

## 2019-07-07 ENCOUNTER — Ambulatory Visit (INDEPENDENT_AMBULATORY_CARE_PROVIDER_SITE_OTHER): Payer: BC Managed Care – PPO | Admitting: Rehabilitative and Restorative Service Providers"

## 2019-07-07 ENCOUNTER — Encounter: Payer: BC Managed Care – PPO | Admitting: Rehabilitative and Restorative Service Providers"

## 2019-07-07 DIAGNOSIS — M25611 Stiffness of right shoulder, not elsewhere classified: Secondary | ICD-10-CM

## 2019-07-07 DIAGNOSIS — M6281 Muscle weakness (generalized): Secondary | ICD-10-CM

## 2019-07-07 DIAGNOSIS — M25511 Pain in right shoulder: Secondary | ICD-10-CM

## 2019-07-07 DIAGNOSIS — G8929 Other chronic pain: Secondary | ICD-10-CM | POA: Diagnosis not present

## 2019-07-07 NOTE — Telephone Encounter (Signed)
Pls advise. Thanks.  

## 2019-07-07 NOTE — Telephone Encounter (Signed)
Patient called. He would like a refill on hydrocodone. His call back number is [phone]

## 2019-07-07 NOTE — Therapy (Signed)
[facility] Physical Therapy [address] [place], [place], [address] Phone: [phone]   Fax:  [phone]  Physical Therapy Treatment  Patient Details  Name: Greg Munoz MRN: [id number] Date of Birth: 08/24/1950 Referring Provider (PT): Meredith Pel MD   Encounter Date: 07/07/2019  PT End of Session - 07/07/19 1029    Visit Number  6    Number of Visits  18    Date for PT Re-Evaluation  07/23/19    Authorization Type  BCBS    PT Start Time  1022    PT Stop Time  1100    PT Time Calculation (min)  38 min    Activity Tolerance  Patient tolerated treatment well    Behavior During Therapy  [facility] for tasks assessed/performed       Past Medical History:  Diagnosis Date  . Anxiety   . Arthritis    knee  . Depression   . ED (erectile dysfunction)   . HSV infection     Past Surgical History:  Procedure Laterality Date  . COLONOSCOPY  2012, 2018   x 2  . EYE SURGERY Bilateral    lasik  . KNEE SURGERY     arthroscopic knee - pt unsure of which leg  . 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 WISDOM TOOTH EXTRACTION      There were no vitals filed for this visit.  Subjective Assessment - 07/07/19 1028    Subjective  Pt. indicated feeling some soreness insidious at times, 4/10 or so at worst since last visit.    Limitations  Lifting   lifting/ pushing/ carrying   How long can you sit comfortably?  unlimited    How long can you stand comfortably?  unlimited    How long can you walk comfortably?  unlimited    Diagnostic tests  x-ray 05/17/2019    Patient Stated Goals  get full ROM, increase stretngth,be able to return to playing basketball, return to playing golf    Currently in Pain?  No/denies    Pain Score  0-No pain    Pain Location  Shoulder    Pain Orientation  Right    Pain Descriptors /  Indicators  Aching    Pain Type  Surgical pain    Pain Onset  More than a month ago    Pain Frequency  Occasional    Aggravating Factors   insidous onset reported.    Pain Relieving Factors  rest                       [facility] Adult PT Treatment/Exercise - 07/07/19 0001      Shoulder Exercises: Standing   External Rotation  Strengthening;Right   3 x 10   Theraband Level (Shoulder External Rotation)  Level 3 (Green)    Internal Rotation  Strengthening   3 x 10   Theraband Level (Shoulder Internal Rotation)  Level 3 (Green)    Flexion  Strengthening;Right   3 x 10 90 deg 2 lbs   Shoulder Flexion Weight (lbs)  2    Other Standing Exercises  standing scaption 3 x 10 2 lbs 90 deg    Other Standing Exercises  wall flexion c step 3 x 10 c arm ranger      Manual Therapy   Joint Mobilization  g3-g4  inferior jt mobs, ap mobs Rt gh jt. MWM c ER               PT Short Term Goals - 05/28/19 1545      PT SHORT TERM GOAL #1   Title  pt to be I with inital HEP    Time  4    Period  Weeks    Status  New    Target Date  06/25/19      PT SHORT TERM GOAL #2   Title  increase R shoulder AROM flexion/ abduction to >/= 100 degrees for therapuetic progression with </= 4/10    Time  6    Period  Weeks    Status  New    Target Date  07/09/19        PT Long Term Goals - 07/07/19 1030      PT LONG TERM GOAL #1   Title  increase R shoulder flxion/ abduction to >/= 130 degrees and ER/IR to [facility] compared bil with </= 2/10 pain for functional ROM required for ADLs    Time  8    Period  Weeks    Status  On-going    Target Date  07/23/19      PT LONG TERM GOAL #2   Title  increase R shoulder gross strength to >/= 4+/5 to promote shoulder stability    Time  8    Period  Weeks    Status  On-going    Target Date  07/23/19      PT LONG TERM GOAL #3   Title  pt to be able to lift and lower >/= 10# to and from an overhead shelf and push/pull >/= 20# for functional strength  for ADLs    Time  8    Period  Weeks    Status  On-going    Target Date  07/23/19      PT LONG TERM GOAL #4   Title  pt to be able to leisurely shoot basketball with no pain or limtatoins per pt's personal goal    Time  8    Period  Weeks    Status  On-going    Target Date  07/23/19      PT LONG TERM GOAL #5   Title  pt to be I with all HEP given as of last visit to maintain and progress current level of function.    Time  8    Period  Weeks    Status  On-going    Target Date  07/23/19            Plan - 07/07/19 1032    Clinical Impression Statement  Elevation attempts against gravity good through 90/100 deg c mobility restriction evident at end range.  Minimal shrug sign noted close to end range.  Overall strengthening indicated to progress activity level c Rt UE in daily use.    Personal Factors and Comorbidities  Comorbidity 1    Comorbidities  hx of anxiety    Stability/Clinical Decision Making  Stable/Uncomplicated    Rehab Potential  Good    PT Frequency  3x / week   3 x a week for 2 weeks then dropping down to 2 x a week   PT Duration  8 weeks    PT Treatment/Interventions  ADLs/Self Care Home Management;Cryotherapy;Electrical Stimulation;Iontophoresis 4mg /ml Dexamethasone;Moist Heat;Ultrasound;Therapeutic activities;Therapeutic exercise;Balance training;Neuromuscular re-education;Patient/family education;Manual techniques;Passive range of motion;Taping;Vasopneumatic Device    PT Next Visit Plan  Continued focus on improvement in HEP.  Progress note next visit.    PT Home Exercise Plan  [email or web address]    Consulted and Agree with Plan of Care  Patient       Patient will benefit from skilled therapeutic intervention in order to improve the following deficits and impairments:  Pain, Improper body mechanics, Increased muscle spasms, Decreased strength, Postural dysfunction, Impaired UE functional use, Decreased activity tolerance, Decreased endurance  Visit  Diagnosis: Chronic right shoulder pain  Stiffness of right shoulder, not elsewhere classified  Muscle weakness (generalized)     Problem List Patient Active Problem List   Diagnosis Date Noted  . Sleep apnea in adult 10/20/2015  . Annual physical exam 11/24/2013  . Persistent insomnia 11/23/2012  . Elevated blood pressure reading 08/23/2011  . Body mass index 27.0-27.9, adult 08/16/2011  . Herpes simplex 03/15/2011  . Disorder of external ear 11/27/2009  . ED (erectile dysfunction) of organic origin 08/19/2008    Chyrel Masson, PT, DPT, OCS, ATC 07/07/19  10:50 AM    [facility] Physical Therapy [address] [place], [place], [address] Phone: [phone]   Fax:  [phone]  Name: CANDY ZIEGLER MRN: [id number] Date of Birth: [birth year]

## 2019-07-07 NOTE — Telephone Encounter (Signed)
Tried calling patient. No answer. LMVM advising per message below. Asked him on VM to please call our office and schedule follow up appt with Dr August Saucer.

## 2019-07-08 ENCOUNTER — Telehealth: Payer: Self-pay | Admitting: Orthopedic Surgery

## 2019-07-08 ENCOUNTER — Other Ambulatory Visit: Payer: Self-pay | Admitting: Surgical

## 2019-07-08 MED ORDER — HYDROCODONE-ACETAMINOPHEN 5-325 MG PO TABS: 1.0000 | ORAL_TABLET | Freq: Every day | ORAL | 0 refills | Status: DC | PRN

## 2019-07-08 NOTE — Telephone Encounter (Signed)
Submitted RX for Norco daily prn.  Thanks for clearing up the confusion, just wanted to make sure he had a follow-up appointment

## 2019-07-08 NOTE — Telephone Encounter (Signed)
I spoke with patient. Apparently there was a miscommunication at his last OV in March.  He was not aware that he needed follow up appointment.   I scheduled him for next Thursday morning. He also has been back to physical therapy multiple times. I told patient I would talk with you about getting his medication refilled. Can you please submit refill? He will be here next week.

## 2019-07-08 NOTE — Telephone Encounter (Signed)
Patient called asked for a call back before he schedules his follow up appointment with Dr August Saucer. The number to contact patient is [phone]

## 2019-07-08 NOTE — Telephone Encounter (Signed)
IC LMVM advising patient rx submitted.

## 2019-07-13 ENCOUNTER — Ambulatory Visit (INDEPENDENT_AMBULATORY_CARE_PROVIDER_SITE_OTHER): Payer: BC Managed Care – PPO | Admitting: Rehabilitative and Restorative Service Providers"

## 2019-07-13 ENCOUNTER — Ambulatory Visit: Payer: BC Managed Care – PPO | Attending: Internal Medicine

## 2019-07-13 ENCOUNTER — Other Ambulatory Visit: Payer: Self-pay

## 2019-07-13 DIAGNOSIS — G8929 Other chronic pain: Secondary | ICD-10-CM

## 2019-07-13 DIAGNOSIS — M25511 Pain in right shoulder: Secondary | ICD-10-CM | POA: Diagnosis not present

## 2019-07-13 DIAGNOSIS — M6281 Muscle weakness (generalized): Secondary | ICD-10-CM | POA: Diagnosis not present

## 2019-07-13 DIAGNOSIS — M25611 Stiffness of right shoulder, not elsewhere classified: Secondary | ICD-10-CM | POA: Diagnosis not present

## 2019-07-13 DIAGNOSIS — Z23 Encounter for immunization: Secondary | ICD-10-CM

## 2019-07-13 NOTE — Therapy (Signed)
[facility] Physical Therapy [address] [place], [place], [address] Phone: [phone]   Fax:  [phone]  Physical Therapy Treatment  Patient Details  Name: Greg Munoz MRN: [id number] Date of Birth: [birth year] Referring Provider (PT): Meredith Pel MD   Encounter Date: 07/13/2019  PT End of Session - 07/13/19 1420    Visit Number  7    Number of Visits  18    Date for PT Re-Evaluation  07/23/19    Authorization Type  BCBS    PT Start Time  1400    PT Stop Time  1442    PT Time Calculation (min)  42 min    Activity Tolerance  Patient tolerated treatment well    Behavior During Therapy  [facility] for tasks assessed/performed       Past Medical History:  Diagnosis Date  . Anxiety   . Arthritis    knee  . Depression   . ED (erectile dysfunction)   . HSV infection     Past Surgical History:  Procedure Laterality Date  . COLONOSCOPY  2012, 2018   x 2  . EYE SURGERY Bilateral    lasik  . KNEE SURGERY     arthroscopic knee - pt unsure of which leg  . 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 WISDOM TOOTH EXTRACTION      There were no vitals filed for this visit.                    [place] Adult PT Treatment/Exercise - 07/13/19 0001      Shoulder Exercises: Sidelying   External Rotation  Strengthening;Right   3 x 15   External Rotation Weight (lbs)  2    Flexion  Strengthening;Right   3 x 15    ABduction  Right   3 x 15 1 lb   ABduction Weight (lbs)  1      Shoulder Exercises: Standing   Other Standing Exercises  standing flexion to 90 deg c horizontal abduction and return 2 x 10    Other Standing Exercises  wall flexion c step 3 x 10 c arm ranger      Shoulder Exercises: ROM/Strengthening   UBE (Upper Arm Bike)  Lvl 2.5 2.5 mins fwd/rev each      Manual Therapy   Joint Mobilization  g3-g4 inferior jt mobs, ap mobs Rt gh jt. MWM c ER             PT Education - 07/13/19 1426    Education Details  Adjusted HEP program c cues in clinic    Person(s) Educated  Patient    Methods  Explanation;Demonstration;Handout;Verbal cues    Comprehension  Verbalized understanding;Returned demonstration;Verbal cues required       PT Short Term Goals - 05/28/19 1545      PT SHORT TERM GOAL #1   Title  pt to be I with inital HEP    Time  4    Period  Weeks    Status  New    Target Date  06/25/19      PT SHORT TERM GOAL #2   Title  increase R shoulder AROM flexion/ abduction to >/= 100 degrees for therapuetic progression with </= 4/10    Time  6    Period  Weeks    Status  New  Target Date  07/09/19        PT Long Term Goals - 07/07/19 1030      PT LONG TERM GOAL #1   Title  increase R shoulder flxion/ abduction to >/= 130 degrees and ER/IR to [facility] compared bil with </= 2/10 pain for functional ROM required for ADLs    Time  8    Period  Weeks    Status  On-going    Target Date  07/23/19      PT LONG TERM GOAL #2   Title  increase R shoulder gross strength to >/= 4+/5 to promote shoulder stability    Time  8    Period  Weeks    Status  On-going    Target Date  07/23/19      PT LONG TERM GOAL #3   Title  pt to be able to lift and lower >/= 10# to and from an overhead shelf and push/pull >/= 20# for functional strength for ADLs    Time  8    Period  Weeks    Status  On-going    Target Date  07/23/19      PT LONG TERM GOAL #4   Title  pt to be able to leisurely shoot basketball with no pain or limtatoins per pt's personal goal    Time  8    Period  Weeks    Status  On-going    Target Date  07/23/19      PT LONG TERM GOAL #5   Title  pt to be I with all HEP given as of last visit to maintain and progress current level of function.    Time  8    Period  Weeks    Status  On-going    Target Date  07/23/19            Plan -  07/13/19 1428    Clinical Impression Statement  Horizontal abduction, posterior capsule movement restricted c ER in abduction movement as primarly mobility impairments at this time.  Pt. progressed in HEP to include progressive resistive gravity reduced strengthening AROM.  Pt. may continue to benefit from guided skilled PT services and HEP improvement to reach goals.    Personal Factors and Comorbidities  Comorbidity 1    Comorbidities  hx of anxiety    Stability/Clinical Decision Making  Stable/Uncomplicated    Rehab Potential  Good    PT Frequency  3x / week   3 x a week for 2 weeks then dropping down to 2 x a week   PT Duration  8 weeks    PT Treatment/Interventions  ADLs/Self Care Home Management;Cryotherapy;Electrical Stimulation;Iontophoresis 4mg /ml Dexamethasone;Moist Heat;Ultrasound;Therapeutic activities;Therapeutic exercise;Balance training;Neuromuscular re-education;Patient/family education;Manual techniques;Passive range of motion;Taping;Vasopneumatic Device    PT Next Visit Plan  Progress note for goal reassessment next visit.    PT Home Exercise Plan  [email or web address]    Consulted and Agree with Plan of Care  Patient       Patient will benefit from skilled therapeutic intervention in order to improve the following deficits and impairments:  Pain, Improper body mechanics, Increased muscle spasms, Decreased strength, Postural dysfunction, Impaired UE functional use, Decreased activity tolerance, Decreased endurance  Visit Diagnosis: Chronic right shoulder pain  Stiffness of right shoulder, not elsewhere classified  Muscle weakness (generalized)     Problem List Patient Active Problem List   Diagnosis Date Noted  . Sleep apnea in adult 10/20/2015  . Annual physical exam 11/24/2013  .  Persistent insomnia 11/23/2012  . Elevated blood pressure reading 08/23/2011  . Body mass index 27.0-27.9, adult 08/16/2011  . Herpes simplex 03/15/2011  . Disorder of external ear 11/27/2009  .  ED (erectile dysfunction) of organic origin 08/19/2008   Chyrel Masson, PT, DPT, OCS, ATC 07/13/19  2:43 PM    [facility] Physical Therapy [address] [place], [place], [address] Phone: [phone]   Fax:  [phone]  Name: Greg Munoz MRN: [id number] Date of Birth: [birth year]

## 2019-07-13 NOTE — Progress Notes (Signed)
   Covid-19 Vaccination Clinic  Name:  Greg Munoz    MRN: [id number] DOB: Sep 21, 1950  07/13/2019  [name] was observed post Covid-19 immunization for 15 minutes without incident. He was provided with Vaccine Information Sheet and instruction to access the V-Safe system.   [name] was instructed to call 911 with any severe reactions post vaccine: Marland Kitchen Difficulty breathing  . Swelling of face and throat  . A fast heartbeat  . A bad rash all over body  . Dizziness and weakness   Immunizations Administered    Name Date Dose VIS Date Route   Pfizer COVID-19 Vaccine 07/13/2019  4:29 PM 0.3 mL 05/12/2018 Intramuscular   Manufacturer: [employer], [employer]   Lot: [id number]   NDC: [address]-0

## 2019-07-13 NOTE — Patient Instructions (Signed)
Access Code: [email or web address] URL: [email or web address] Date: 07/13/2019 Prepared by: Chyrel Masson  Exercises Standing Single Arm Shoulder Flexion Stretch on Wall - 2 x daily - 7 x weekly - 2 sets - 10 reps - 5 hold Standing Shoulder External Rotation Stretch in Doorway - 1-2 x daily - 7 x weekly - 1 sets - 3-5 reps - 15-30 hold Standing Shoulder Row with Anchored Resistance - 1 x daily - 7 x weekly - 3 sets - 10 reps Shoulder Extension with Resistance - 1 x daily - 7 x weekly - 3 sets - 10 reps Sidelying Shoulder Abduction Palm Forward - 1 x daily - 7 x weekly - 3 sets - 15 reps Sidelying Shoulder Flexion 15 Degrees - 1 x daily - 7 x weekly - 3 sets - 15 reps Supine Shoulder Horizontal Abduction with Resistance - 1 x daily - 7 x weekly - 3 sets - 10 reps Sidelying Shoulder External Rotation - 1 x daily - 7 x weekly - 3 sets - 15 reps

## 2019-07-15 ENCOUNTER — Ambulatory Visit (INDEPENDENT_AMBULATORY_CARE_PROVIDER_SITE_OTHER): Payer: BC Managed Care – PPO | Admitting: Orthopedic Surgery

## 2019-07-15 ENCOUNTER — Other Ambulatory Visit: Payer: Self-pay

## 2019-07-15 ENCOUNTER — Encounter: Payer: Self-pay | Admitting: Orthopedic Surgery

## 2019-07-15 DIAGNOSIS — M19011 Primary osteoarthritis, right shoulder: Secondary | ICD-10-CM

## 2019-07-15 NOTE — Progress Notes (Signed)
   Post-Op Visit Note   Patient: Greg Munoz           Date of Birth: [birth year]           MRN: [id number] Visit Date: 07/15/2019 PCP: Tracey Harries, MD   Assessment & Plan:  Chief Complaint:  Chief Complaint  Patient presents with  . Right Shoulder - Follow-up   Visit Diagnoses:  1. Primary osteoarthritis of right shoulder     Plan: Arihant is now about 2 months out right total shoulder replacement.  Going to physical therapy.  Taking Norco as needed.  Overall he is doing well with improved pain relief.  On exam he has good subscap strength.  External rotation is to about 35 degrees.  Forward flexion and abduction both over 90.  But just over 90.  He is also doing home exercise program.  Continue with therapy for stretching.  I think at this time in his recovery he can be slightly more aggressive about his rehab in terms of getting more motion.  Overall though the deep gnawing pain has improved compared to preoperatively.  Come back in 2 months for final check.  Follow-Up Instructions: No follow-ups on file.   Orders:  No orders of the defined types were placed in this encounter.  No orders of the defined types were placed in this encounter.   Imaging: No results found.  PMFS History: Patient Active Problem List   Diagnosis Date Noted  . Sleep apnea in adult 10/20/2015  . Annual physical exam 11/24/2013  . Persistent insomnia 11/23/2012  . Elevated blood pressure reading 08/23/2011  . Body mass index 27.0-27.9, adult 08/16/2011  . Herpes simplex 03/15/2011  . Disorder of external ear 11/27/2009  . 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 Anxiety   . Arthritis    knee  . Depression   . ED (erectile dysfunction)   . HSV infection     No family history on file.  Past Surgical History:  Procedure Laterality Date  . COLONOSCOPY  2012, 2018   x 2  . EYE SURGERY Bilateral    lasik  . KNEE SURGERY     arthroscopic knee - pt unsure of which leg  . TOTAL SHOULDER ARTHROPLASTY Right 05/04/2019   Procedure: RIGHT TOTAL SHOULDER ARTHROPLASTY;  Surgeon: Cammy Copa, MD;  Location: [facility] OR;  Service: Orthopedics;  Laterality: Right;  . WISDOM TOOTH EXTRACTION     Social History   Occupational History  . Not on file  Tobacco Use  . Smoking status: Never Smoker  . Smokeless tobacco: Never Used  Substance and Sexual Activity  . Alcohol use: Yes    Alcohol/week: 7.0 - 10.0 standard drinks    Types: 7 - 10 Standard drinks or equivalent per week  . Drug use: Never  . Sexual activity: Yes

## 2019-07-21 ENCOUNTER — Ambulatory Visit (INDEPENDENT_AMBULATORY_CARE_PROVIDER_SITE_OTHER): Payer: BC Managed Care – PPO | Admitting: Rehabilitative and Restorative Service Providers"

## 2019-07-21 ENCOUNTER — Other Ambulatory Visit: Payer: Self-pay

## 2019-07-21 ENCOUNTER — Encounter: Payer: Self-pay | Admitting: Rehabilitative and Restorative Service Providers"

## 2019-07-21 DIAGNOSIS — M25611 Stiffness of right shoulder, not elsewhere classified: Secondary | ICD-10-CM

## 2019-07-21 DIAGNOSIS — G8929 Other chronic pain: Secondary | ICD-10-CM | POA: Diagnosis not present

## 2019-07-21 DIAGNOSIS — M25511 Pain in right shoulder: Secondary | ICD-10-CM

## 2019-07-21 DIAGNOSIS — M6281 Muscle weakness (generalized): Secondary | ICD-10-CM | POA: Diagnosis not present

## 2019-07-21 NOTE — Therapy (Signed)
[facility] Physical Therapy [address] [place], [place], [address] Phone: [phone]   Fax:  [phone]  Physical Therapy Treatment  Patient Details  Name: Greg Munoz MRN: [id number] Date of Birth: December 22, 1950 Referring Provider (PT): Cammy Copa MD   Encounter Date: 07/21/2019  PT End of Session - 07/21/19 1622    Visit Number  8    Number of Visits  18    Date for PT Re-Evaluation  07/23/19    Authorization Type  BCBS    PT Start Time  1600    PT Stop Time  1642    PT Time Calculation (min)  42 min    Activity Tolerance  Patient tolerated treatment well    Behavior During Therapy  [facility] for tasks assessed/performed       Past Medical History:  Diagnosis Date  . Anxiety   . Arthritis    knee  . Depression   . ED (erectile dysfunction)   . HSV infection     Past Surgical History:  Procedure Laterality Date  . COLONOSCOPY  2012, 2018   x 2  . EYE SURGERY Bilateral    lasik  . KNEE SURGERY     arthroscopic knee - pt unsure of which leg  . TOTAL SHOULDER ARTHROPLASTY Right 05/04/2019   Procedure: RIGHT TOTAL SHOULDER ARTHROPLASTY;  Surgeon: Cammy Copa, MD;  Location[facility] OR;  Service: Orthopedics;  Laterality: Right;  . WISDOM TOOTH EXTRACTION      There were no vitals filed for this visit.  Subjective Assessment - 07/21/19 1638    Subjective  Pt. stated no real pain complaints since last visit.    Limitations  Lifting   lifting/ pushing/ carrying   How long can you sit comfortably?  unlimited    How long can you stand comfortably?  unlimited    How long can you walk comfortably?  unlimited    Diagnostic tests  x-ray 05/17/2019    Patient Stated Goals  get full ROM, increase stretngth,be able to return to playing basketball, return to playing golf    Currently in Pain?  No/denies    Pain Score  0-No pain    Pain Location  Shoulder    Pain Orientation  Right    Pain Onset  More than a month ago    Pain Frequency  Rarely     Aggravating Factors   no real pain to report    Pain Relieving Factors  n/A                       [facility] Adult PT Treatment/Exercise - 07/21/19 0001      Shoulder Exercises: Sidelying   External Rotation  Strengthening;Right    External Rotation Weight (lbs)  2    Flexion  Strengthening;Right   3 x 15   Flexion Weight (lbs)  1    ABduction  Right   3 x10   ABduction Weight (lbs)  2      Shoulder Exercises: Standing   Other Standing Exercises  standing flexion to 90 deg c horizontal abduction and return 2 x 10   1 lb   Other Standing Exercises  90 deg flexion at wall ball circles 30 x 3 clockwise, counter clockwise      Shoulder Exercises: ROM/Strengthening   UBE (Upper Arm Bike)  lvl 2 5 mins fwd/rev each      Manual Therapy   Joint Mobilization  g4 inferior jt  mobs, prom,  MWM c ER in 45 deg abd 60 deg abd               PT Short Term Goals - 05/28/19 1545      PT SHORT TERM GOAL #1   Title  pt to be I with inital HEP    Time  4    Period  Weeks    Status  New    Target Date  06/25/19      PT SHORT TERM GOAL #2   Title  increase R shoulder AROM flexion/ abduction to >/= 100 degrees for therapuetic progression with </= 4/10    Time  6    Period  Weeks    Status  New    Target Date  07/09/19        PT Long Term Goals - 07/07/19 1030      PT LONG TERM GOAL #1   Title  increase R shoulder flxion/ abduction to >/= 130 degrees and ER/IR to [facility] compared bil with </= 2/10 pain for functional ROM required for ADLs    Time  8    Period  Weeks    Status  On-going    Target Date  07/23/19      PT LONG TERM GOAL #2   Title  increase R shoulder gross strength to >/= 4+/5 to promote shoulder stability    Time  8    Period  Weeks    Status  On-going    Target Date  07/23/19      PT LONG TERM GOAL #3   Title  pt to be able to lift and lower >/= 10# to and from an overhead shelf and push/pull >/= 20# for functional strength for ADLs    Time  8     Period  Weeks    Status  On-going    Target Date  07/23/19      PT LONG TERM GOAL #4   Title  pt to be able to leisurely shoot basketball with no pain or limtatoins per pt's personal goal    Time  8    Period  Weeks    Status  On-going    Target Date  07/23/19      PT LONG TERM GOAL #5   Title  pt to be I with all HEP given as of last visit to maintain and progress current level of function.    Time  8    Period  Weeks    Status  On-going    Target Date  07/23/19            Plan - 07/21/19 1629    Clinical Impression Statement  Strength improving in progressive resistance activity but fatigue noted in overhead movements.  Pt. showed improvement in HEP knowlede at this time, reflected in his progression overall in presentation.    Personal Factors and Comorbidities  Comorbidity 1    Comorbidities  hx of anxiety    Stability/Clinical Decision Making  Stable/Uncomplicated    Rehab Potential  Good    PT Frequency  3x / week   3 x a week for 2 weeks then dropping down to 2 x a week   PT Duration  8 weeks    PT Treatment/Interventions  ADLs/Self Care Home Management;Cryotherapy;Electrical Stimulation;Iontophoresis 4mg /ml Dexamethasone;Moist Heat;Ultrasound;Therapeutic activities;Therapeutic exercise;Balance training;Neuromuscular re-education;Patient/family education;Manual techniques;Passive range of motion;Taping;Vasopneumatic Device    PT Next Visit Plan  Progress note next visit for possible extension  of POC based off presentation    PT Home Exercise Plan  [id number]    Consulted and Agree with Plan of Care  Patient       Patient will benefit from skilled therapeutic intervention in order to improve the following deficits and impairments:  Pain, Improper body mechanics, Increased muscle spasms, Decreased strength, Postural dysfunction, Impaired UE functional use, Decreased activity tolerance, Decreased endurance  Visit Diagnosis: Chronic right shoulder pain  Stiffness of  right shoulder, not elsewhere classified  Muscle weakness (generalized)     Problem List Patient Active Problem List   Diagnosis Date Noted  . Sleep apnea in adult 10/20/2015  . Annual physical exam 11/24/2013  . Persistent insomnia 11/23/2012  . Elevated blood pressure reading 08/23/2011  . Body mass index 27.0-27.9, adult 08/16/2011  . Herpes simplex 03/15/2011  . Disorder of external ear 11/27/2009  . ED (erectile dysfunction) of organic origin 08/19/2008    Scot Jun, PT, DPT, OCS, ATC 07/21/19  4:38 PM    [facility] Physical Therapy [address] [place], [place], [address] Phone: [phone]   Fax:  [phone]  Name: Greg Munoz MRN: [id number] Date of Birth: 11-22-1950

## 2019-07-29 ENCOUNTER — Ambulatory Visit (INDEPENDENT_AMBULATORY_CARE_PROVIDER_SITE_OTHER): Payer: BC Managed Care – PPO | Admitting: Rehabilitative and Restorative Service Providers"

## 2019-07-29 ENCOUNTER — Encounter: Payer: Self-pay | Admitting: Rehabilitative and Restorative Service Providers"

## 2019-07-29 ENCOUNTER — Other Ambulatory Visit: Payer: Self-pay

## 2019-07-29 DIAGNOSIS — M25611 Stiffness of right shoulder, not elsewhere classified: Secondary | ICD-10-CM

## 2019-07-29 DIAGNOSIS — M6281 Muscle weakness (generalized): Secondary | ICD-10-CM

## 2019-07-29 DIAGNOSIS — M25511 Pain in right shoulder: Secondary | ICD-10-CM | POA: Diagnosis not present

## 2019-07-29 DIAGNOSIS — G8929 Other chronic pain: Secondary | ICD-10-CM

## 2019-07-29 NOTE — Therapy (Signed)
[facility] Physical Therapy [address] [place], [place], [address] Phone: [phone]   Fax:  [phone]  Physical Therapy Treatment/Progress Note/Recert  Patient Details  Name: Greg Munoz MRN: [id number] Date of Birth: May 04, 1950 Referring Provider (PT): Meredith Pel MD   Encounter Date: 07/29/2019   Progress Note Reporting Period 05/28/2019 to 07/29/2019  See note below for Objective Data and Assessment of Progress/Goals.       PT End of Session - 07/29/19 0851    Visit Number  9    Number of Visits  18    Date for PT Re-Evaluation  08/26/19    Authorization Type  BCBS/Medicare    Authorization Time Period  4/65/6812 - 7/51/7001 certifcation    Progress Note Due on Visit  19    PT Start Time  0845    PT Stop Time  0925    PT Time Calculation (min)  40 min    Activity Tolerance  Patient tolerated treatment well    Behavior During Therapy  [facility] for tasks assessed/performed       Past Medical History:  Diagnosis Date  . Anxiety   . Arthritis    knee  . Depression   . ED (erectile dysfunction)   . HSV infection     Past Surgical History:  Procedure Laterality Date  . COLONOSCOPY  2012, 2018   x 2  . EYE SURGERY Bilateral    lasik  . KNEE SURGERY     arthroscopic knee - pt unsure of which leg  . TOTAL SHOULDER ARTHROPLASTY Right 05/04/2019   Procedure: RIGHT TOTAL SHOULDER ARTHROPLASTY;  Surgeon: Meredith Pel, MD;  Location: [place];  Service: Orthopedics;  Laterality: Right;  . WISDOM TOOTH EXTRACTION      There were no vitals filed for this visit.  Subjective Assessment - 07/29/19 0848    Subjective  Pt. indicated no pain today, pain at worst 2/10 or so in last week.  Pt. reported overall improvement to normal at 90 % overall at this time.   Pt. indicated he is happy with surgery and progress to this point.    Limitations  Lifting   lifting/ pushing/ carrying   How long can you sit comfortably?  unlimited    How long  can you stand comfortably?  unlimited    How long can you walk comfortably?  unlimited    Diagnostic tests  x-ray 05/17/2019    Patient Stated Goals  get full ROM, increase stretngth,be able to return to playing basketball, return to playing golf    Currently in Pain?  No/denies    Pain Score  0-No pain    Pain Location  Shoulder    Pain Orientation  Right    Pain Descriptors / Indicators  Aching    Pain Type  Surgical pain    Pain Onset  More than a month ago    Pain Frequency  Rarely    Aggravating Factors   occasional insidious soreness    Effect of Pain on Daily Activities  Reported limitations in shooting basketball at [facility].         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    Referring Provider (PT)  Meredith Pel MD    Onset Date/Surgical Date  05/04/19    Hand Dominance  Right      AROM   Right Shoulder  Flexion  140 Degrees    Right Shoulder ABduction  125 Degrees    Right Shoulder Internal Rotation  40 Degrees    Right Shoulder External Rotation  40 Degrees      Strength   Strength Assessment Site  Shoulder    Right/Left Shoulder  Left;Right    Right Shoulder Flexion  4+/5    Right Shoulder ABduction  4+/5    Right Shoulder Internal Rotation  5/5    Right Shoulder External Rotation  5/5                    [facility] Adult PT Treatment/Exercise - 07/29/19 0001      Shoulder Exercises: Sidelying   External Rotation  Strengthening;Right   3x15   External Rotation Weight (lbs)  2    Flexion  Strengthening;Right   3x15   Flexion Weight (lbs)  1    ABduction  Strengthening;Right   3x15   ABduction Weight (lbs)  2      Shoulder Exercises: Pulleys   Flexion  3 minutes    Scaption  3 minutes      Shoulder Exercises: ROM/Strengthening   UBE (Upper Arm Bike)  Lvl 2 3 mins each way fwd/back      Manual Therapy   Manual therapy comments  Pin and stretch, MET for  er/ir mobility (infraspinatus, subscap, lat)             PT Education - 07/29/19 0850    Education Details  HEP plan transitioning. Long term progression plan c HEP to continue progress, POC update information.    Person(s) Educated  Patient    Methods  Explanation;Demonstration    Comprehension  Verbalized understanding;Returned demonstration       PT Short Term Goals - 07/29/19 0850      PT SHORT TERM GOAL #1   Title  pt to be I with inital HEP    Period  Weeks    Status  Achieved      PT SHORT TERM GOAL #2   Title  increase R shoulder AROM flexion/ abduction to >/= 100 degrees for therapuetic progression with </= 4/10    Period  Weeks    Status  Achieved        PT Long Term Goals - 07/29/19 8295      PT LONG TERM GOAL #1   Title  increase R shoulder flxion/ abduction to >/= 130 degrees and ER/IR to [facility] compared bil with </= 2/10 pain for functional ROM required for ADLs    Time  4    Period  Weeks    Status  Partially Met    Target Date  08/26/19      PT LONG TERM GOAL #2   Title  increase R shoulder gross strength to >/= 4+/5 to promote shoulder stability    Status  Achieved      PT LONG TERM GOAL #3   Title  pt to be able to lift and lower >/= 10# to and from an overhead shelf and push/pull >/= 20# for functional strength for ADLs    Time  4    Period  Weeks    Status  On-going    Target Date  08/26/19      PT LONG TERM GOAL #4   Title  pt to be able to leisurely shoot basketball with no pain or limtatoins per pt's personal goal    Time  4    Period  Weeks    Status  On-going    Target Date  08/26/19      PT LONG TERM GOAL #5   Title  pt to be I with all HEP given as of last visit to maintain and progress current level of function.    Time  4    Period  Weeks    Status  On-going    Target Date  08/26/19            Plan - 07/29/19 0037    Clinical Impression Statement  Pt. has attended 9 visits overall during course of treatment c  variable attendance record at the beginning but improved in last few weeks.  Pt. has reported minimal pain complaints to this point and reported improvement towards PLOF around 90%.  See objective data for updated information showing progress in strength/mobility.  Pt. does report/demonstrate mild restriction/impairments in Rt UE function at this time despite improvements that may still benefit from skilled PT services c transition to HEP as appropriate.    Personal Factors and Comorbidities  Comorbidity 1    Comorbidities  hx of anxiety    Stability/Clinical Decision Making  Stable/Uncomplicated    Rehab Potential  Good    PT Frequency  1x / week   3 x a week for 2 weeks then dropping down to 2 x a week   PT Duration  4 weeks    PT Treatment/Interventions  ADLs/Self Care Home Management;Cryotherapy;Electrical Stimulation;Iontophoresis 67m/ml Dexamethasone;Moist Heat;Ultrasound;Therapeutic activities;Therapeutic exercise;Balance training;Neuromuscular re-education;Patient/family education;Manual techniques;Passive range of motion;Taping;Vasopneumatic Device    PT Next Visit Plan  Recommend up to 1x/week for 4 weeks c transition to HEP as appropriate.    PT Home Exercise Plan  [id number]    Consulted and Agree with Plan of Care  Patient       Patient will benefit from skilled therapeutic intervention in order to improve the following deficits and impairments:  Pain, Improper body mechanics, Increased muscle spasms, Decreased strength, Postural dysfunction, Impaired UE functional use, Decreased activity tolerance, Decreased endurance  Visit Diagnosis: Chronic right shoulder pain - Plan: PT plan of care cert/re-cert  Stiffness of right shoulder, not elsewhere classified - Plan: PT plan of care cert/re-cert  Muscle weakness (generalized) - Plan: PT plan of care cert/re-cert     Problem List Patient Active Problem List   Diagnosis Date Noted  . Sleep apnea in adult 10/20/2015  . Annual  physical exam 11/24/2013  . Persistent insomnia 11/23/2012  . Elevated blood pressure reading 08/23/2011  . Body mass index 27.0-27.9, adult 08/16/2011  . Herpes simplex 03/15/2011  . Disorder of external ear 11/27/2009  . 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

## 2019-08-09 ENCOUNTER — Telehealth: Payer: Self-pay | Admitting: Orthopedic Surgery

## 2019-08-09 ENCOUNTER — Other Ambulatory Visit: Payer: Self-pay | Admitting: Surgical

## 2019-08-09 MED ORDER — HYDROCODONE-ACETAMINOPHEN 5-325 MG PO TABS: 1.0000 | ORAL_TABLET | Freq: Every day | ORAL | 0 refills | Status: AC | PRN

## 2019-08-09 NOTE — Telephone Encounter (Signed)
Sent in RX. Last Norco RX before transition to Tramadol and then off pain medication

## 2019-08-09 NOTE — Telephone Encounter (Signed)
I called patient and advised. 

## 2019-08-09 NOTE — Telephone Encounter (Signed)
Patient called.  He is requesting a refill on his hydrocodone.   Call back: [phone]

## 2019-08-09 NOTE — Telephone Encounter (Signed)
Please advise 

## 2019-08-18 ENCOUNTER — Encounter: Payer: Self-pay | Admitting: Rehabilitative and Restorative Service Providers"

## 2019-08-18 ENCOUNTER — Other Ambulatory Visit: Payer: Self-pay

## 2019-08-18 ENCOUNTER — Ambulatory Visit (INDEPENDENT_AMBULATORY_CARE_PROVIDER_SITE_OTHER): Payer: BC Managed Care – PPO | Admitting: Rehabilitative and Restorative Service Providers"

## 2019-08-18 DIAGNOSIS — M25611 Stiffness of right shoulder, not elsewhere classified: Secondary | ICD-10-CM | POA: Diagnosis not present

## 2019-08-18 DIAGNOSIS — M25511 Pain in right shoulder: Secondary | ICD-10-CM | POA: Diagnosis not present

## 2019-08-18 DIAGNOSIS — G8929 Other chronic pain: Secondary | ICD-10-CM

## 2019-08-18 DIAGNOSIS — M6281 Muscle weakness (generalized): Secondary | ICD-10-CM | POA: Diagnosis not present

## 2019-08-18 NOTE — Therapy (Addendum)
[facility] Physical Therapy [address] [place], [place], [address] Phone: [phone]   Fax:  [phone]  Physical Therapy Treatment/Discharge  Patient Details  Name: Greg Munoz MRN: [id number] Date of Birth: [birth year] Referring Provider (Greg Munoz): Meredith Pel MD   Encounter Date: 08/18/2019  Greg Munoz End of Session - 08/18/19 1609    Visit Number  10    Number of Visits  18    Date for Greg Munoz Re-Evaluation  08/26/19    Authorization Type  BCBS/Medicare    Authorization Time Period  07/04/6220 - 9/79/8921 certifcation    Progress Note Due on Visit  19    Greg Munoz Start Time  1602    Greg Munoz Stop Time  1640    Greg Munoz Time Calculation (min)  38 min    Activity Tolerance  Patient tolerated treatment well    Behavior During Therapy  [facility] for tasks assessed/performed       Past Medical History:  Diagnosis Date  . Anxiety   . Arthritis    knee  . Depression   . ED (erectile dysfunction)   . HSV infection     Past Surgical History:  Procedure Laterality Date  . COLONOSCOPY  2012, 2018   x 2  . EYE SURGERY Bilateral    lasik  . KNEE SURGERY     arthroscopic knee - Greg Munoz unsure of which leg  . TOTAL SHOULDER ARTHROPLASTY Right 05/04/2019   Procedure: RIGHT TOTAL SHOULDER ARTHROPLASTY;  Surgeon: Meredith Pel, MD;  Location: [place];  Service: Orthopedics;  Laterality: Right;  . WISDOM TOOTH EXTRACTION      There were no vitals filed for this visit.  Subjective Assessment - 08/18/19 1608    Subjective  Greg Munoz. stated no pain upon arrival.  Greg Munoz. indicated mild twinge at times but nothing major.  Greg Munoz. indicated knee hurts more than shoulder.    Limitations  Lifting   lifting/ pushing/ carrying   How long can you sit comfortably?  unlimited    How long can you stand comfortably?  unlimited    How long can you walk comfortably?  unlimited    Diagnostic tests  x-ray 05/17/2019    Patient Stated Goals  get full ROM, increase stretngth,be able to return to playing basketball,  return to playing golf    Currently in Pain?  No/denies    Pain Score  0-No pain    Pain Location  Shoulder    Pain Orientation  Right    Pain Descriptors / Indicators  Aching    Pain Type  Surgical pain    Pain Onset  More than a month ago    Pain Frequency  Rarely    Aggravating Factors   insidious at times                        [facility] Adult Greg Munoz Treatment/Exercise - 08/18/19 0001      Shoulder Exercises: Sidelying   External Rotation  Strengthening;Right;Other (comment)   3 x 15   External Rotation Weight (lbs)  3    Flexion  Strengthening;Right;Other (comment)   3 x 10   Flexion Weight (lbs)  2    ABduction  Strengthening;Left;Other (comment)   3 x 15   ABduction Weight (lbs)  2      Shoulder Exercises: Pulleys   Flexion  2 minutes    Scaption  2 minutes      Shoulder Exercises: ROM/Strengthening   UBE (Upper Arm  Bike)  lvl 2.5 5 mins fwd/back               Greg Munoz Short Term Goals - 07/29/19 0850      Greg Munoz SHORT TERM GOAL #1   Title  Greg Munoz to be I with inital HEP    Period  Weeks    Status  Achieved      Greg Munoz SHORT TERM GOAL #2   Title  increase R shoulder AROM flexion/ abduction to >/= 100 degrees for therapuetic progression with </= 4/10    Period  Weeks    Status  Achieved        Greg Munoz Long Term Goals - 07/29/19 9935      Greg Munoz LONG TERM GOAL #1   Title  increase R shoulder flxion/ abduction to >/= 130 degrees and ER/IR to [facility] compared bil with </= 2/10 pain for functional ROM required for ADLs    Time  4    Period  Weeks    Status  Partially Met    Target Date  08/26/19      Greg Munoz LONG TERM GOAL #2   Title  increase R shoulder gross strength to >/= 4+/5 to promote shoulder stability    Status  Achieved      Greg Munoz LONG TERM GOAL #3   Title  Greg Munoz to be able to lift and lower >/= 10# to and from an overhead shelf and push/pull >/= 20# for functional strength for ADLs    Time  4    Period  Weeks    Status  On-going    Target Date  08/26/19       Greg Munoz LONG TERM GOAL #4   Title  Greg Munoz to be able to leisurely shoot basketball with no pain or limtatoins per Greg Munoz's personal goal    Time  4    Period  Weeks    Status  On-going    Target Date  08/26/19      Greg Munoz LONG TERM GOAL #5   Title  Greg Munoz to be I with all HEP given as of last visit to maintain and progress current level of function.    Time  4    Period  Weeks    Status  On-going    Target Date  08/26/19            Plan - 08/18/19 1614    Clinical Impression Statement  Overall showing good control in previous HEP at this time.  Recommend continued progression in strengthening and end range movement to promote return to PLOF.    Personal Factors and Comorbidities  Comorbidity 1    Comorbidities  hx of anxiety    Stability/Clinical Decision Making  Stable/Uncomplicated    Rehab Potential  Good    Greg Munoz Frequency  1x / week   3 x a week for 2 weeks then dropping down to 2 x a week   Greg Munoz Duration  4 weeks    Greg Munoz Treatment/Interventions  ADLs/Self Care Home Management;Cryotherapy;Electrical Stimulation;Iontophoresis 96m/ml Dexamethasone;Moist Heat;Ultrasound;Therapeutic activities;Therapeutic exercise;Balance training;Neuromuscular re-education;Patient/family education;Manual techniques;Passive range of motion;Taping;Vasopneumatic Device    Greg Munoz Next Visit Plan  End of cert june 10.  Next visit June 9 with plan to transition to HEP.    Greg Munoz Home Exercise Plan  [id number]    Consulted and Agree with Plan of Care  Patient       Patient will benefit from skilled therapeutic intervention in order to improve the following deficits and impairments:  Pain,  Improper body mechanics, Increased muscle spasms, Decreased strength, Postural dysfunction, Impaired UE functional use, Decreased activity tolerance, Decreased endurance  Visit Diagnosis: Chronic right shoulder pain  Stiffness of right shoulder, not elsewhere classified  Muscle weakness (generalized)     Problem List Patient Active Problem  List   Diagnosis Date Noted  . Sleep apnea in adult 10/20/2015  . Annual physical exam 11/24/2013  . Persistent insomnia 11/23/2012  . Elevated blood pressure reading 08/23/2011  . Body mass index 27.0-27.9, adult 08/16/2011  . Herpes simplex 03/15/2011  . Disorder of external ear 11/27/2009  . ED (erectile dysfunction) of organic origin 08/19/2008   Greg Munoz, Greg Munoz, Greg Munoz, Greg Munoz, Greg Munoz 08/18/19  4:38 PM  PHYSICAL THERAPY DISCHARGE SUMMARY  Visits from Start of Care: 10  Current functional level related to goals / functional outcomes: See note   Remaining deficits: See note   Education / Equipment: HEP Plan: Patient agrees to discharge.  Patient goals were partially met. Patient is being discharged due to being pleased with the current functional level.  ?????  Greg Munoz, Greg Munoz, Greg Munoz, Greg Munoz, Greg Munoz 10/21/19  11:03 AM        [facility] [address] [place], [place], [address] Phone: [phone]   Fax:  [phone]  Name: Greg Munoz MRN: [id number] Date of Birth: October 27, 1950

## 2019-08-25 ENCOUNTER — Encounter: Payer: BC Managed Care – PPO | Admitting: Rehabilitative and Restorative Service Providers"

## 2019-09-15 ENCOUNTER — Ambulatory Visit: Payer: BC Managed Care – PPO | Admitting: Orthopedic Surgery

## 2020-02-14 ENCOUNTER — Other Ambulatory Visit: Payer: BC Managed Care – PPO

## 2020-02-14 DIAGNOSIS — Z20822 Contact with and (suspected) exposure to covid-19: Secondary | ICD-10-CM

## 2020-02-15 LAB — NOVEL CORONAVIRUS, NAA: SARS-CoV-2, NAA: NOT DETECTED

## 2020-02-15 LAB — SARS-COV-2, NAA 2 DAY TAT

## 2020-02-24 IMAGING — CT CT SHOULDER*R* W/O CM
3 of 4 series · 15 of 33 positions shown, 18 images · non-contrast
Comparison: None.

CLINICAL DATA: Chronic right shoulder pain with decreased range of
motion.

EXAM:
CT OF THE UPPER RIGHT EXTREMITY WITHOUT CONTRAST
TECHNIQUE: Multidetector CT imaging of the upper right extremity was performed
according to the standard protocol.

[Series 6: shoulder 2.00 br40 s3 cor soft · coronal · 0.42mm/px · 3 of 125 slices shown]
[im 25/125  bone]
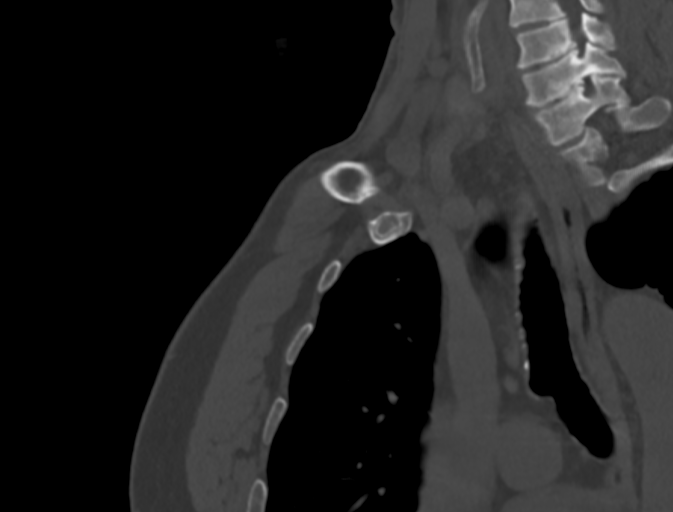
[im 50/125  bone]
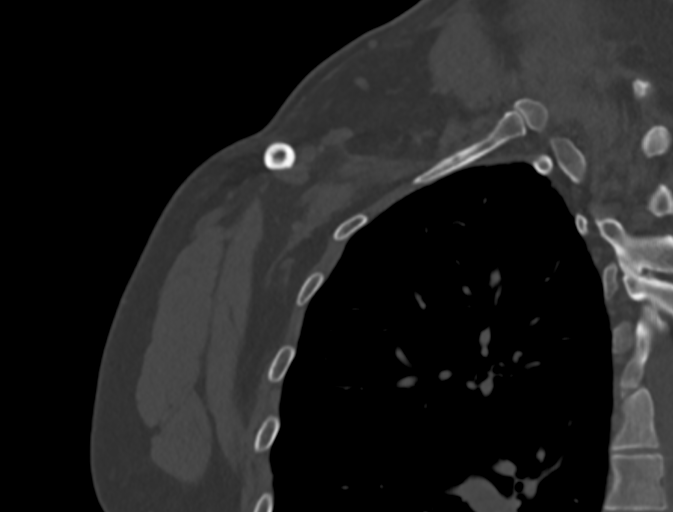
[im 75/125  bone]
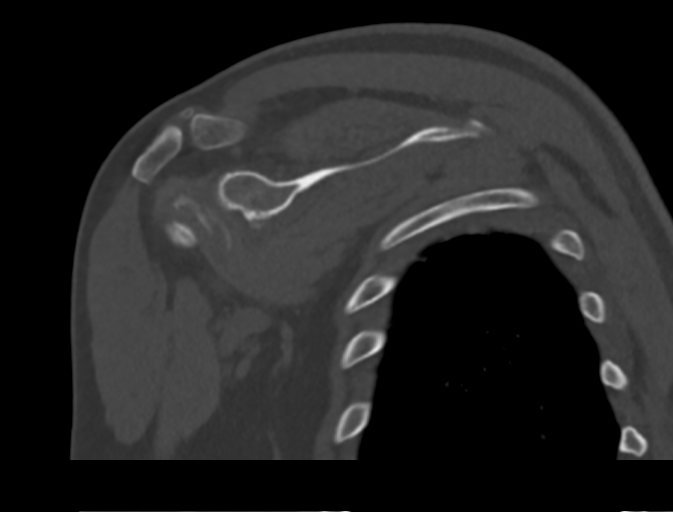

[Series 10: shoulder 2.00 br40 s3 sag soft · sagittal · 0.40mm/px · 5 of 142 slices shown, 6 images]
[im 48/142  bone]
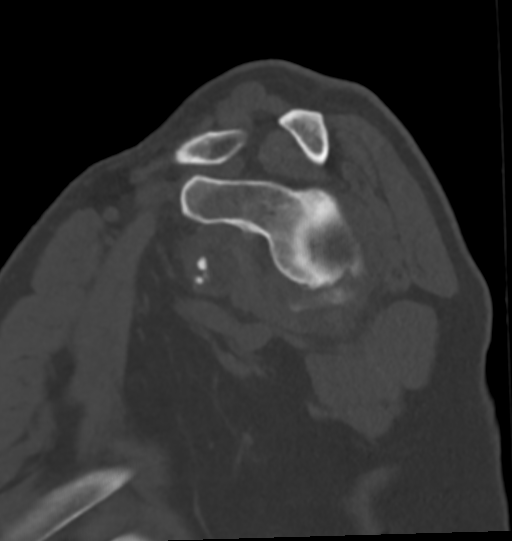
[im 59/142  bone]
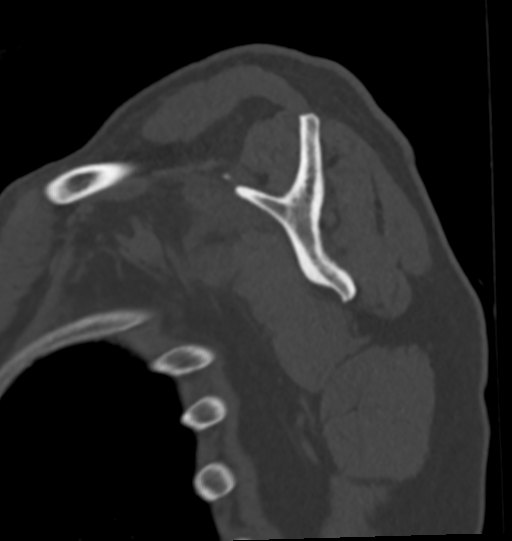
[im 71/142  soft-tissue]
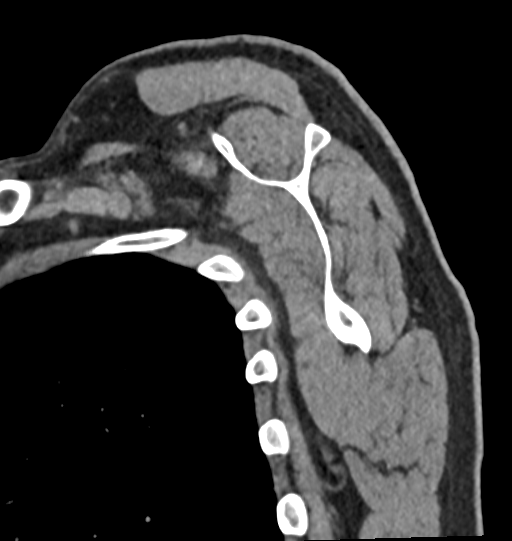
[im 71/142  bone]
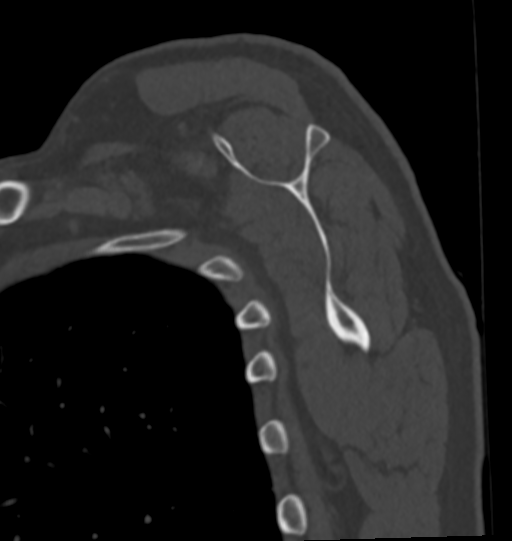
[im 83/142  bone]
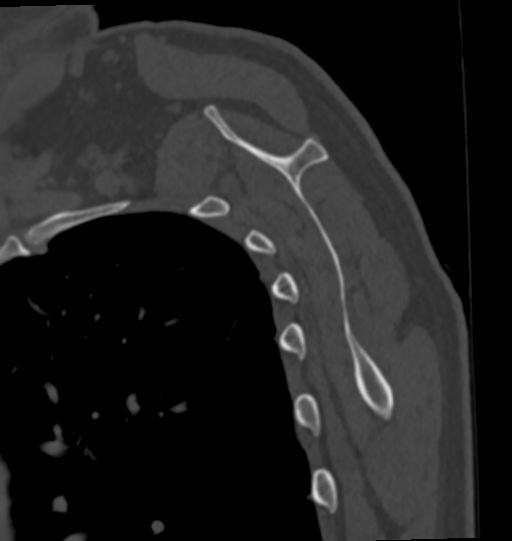
[im 95/142  bone]
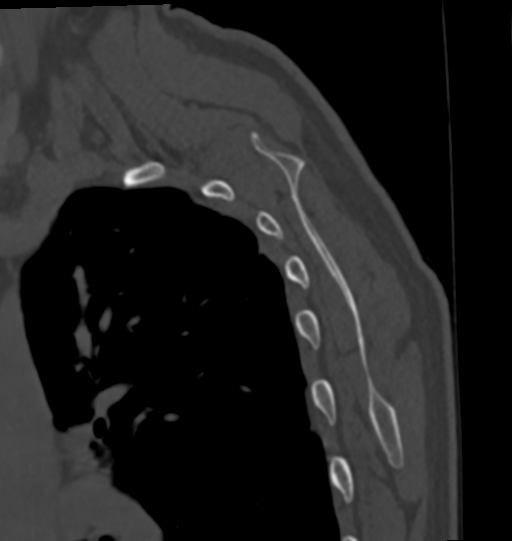

[Series 14: shoulder 0.60 br40 s3 thins soft · axial · 0.54mm/px · z∈[-947,-774]mm · 7 of 362 slices shown, 9 images]
[im 37/362  soft-tissue]
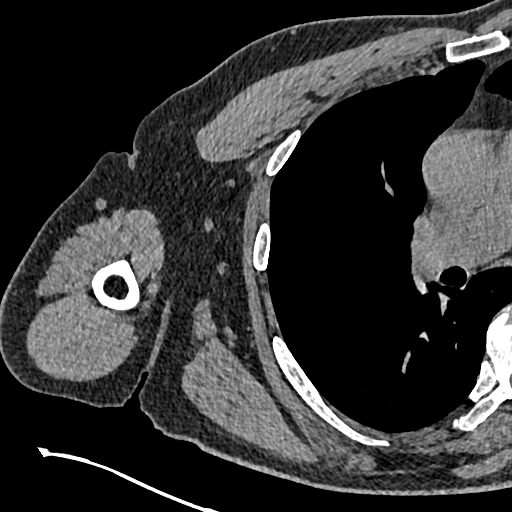
[im 37/362  bone]
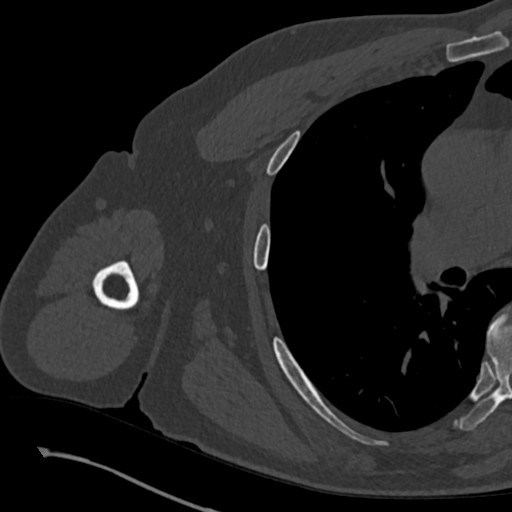
[im 73/362  bone]
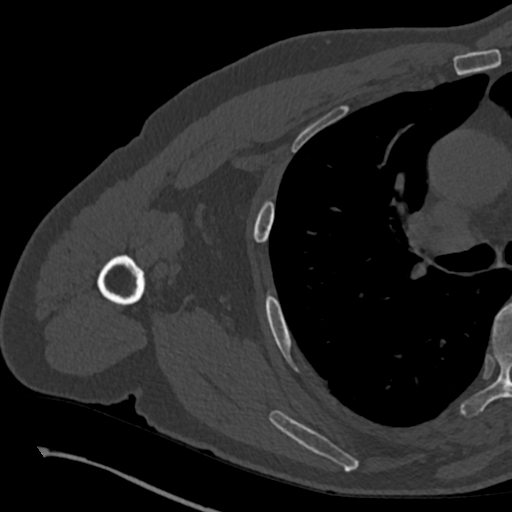
[im 145/362  bone]
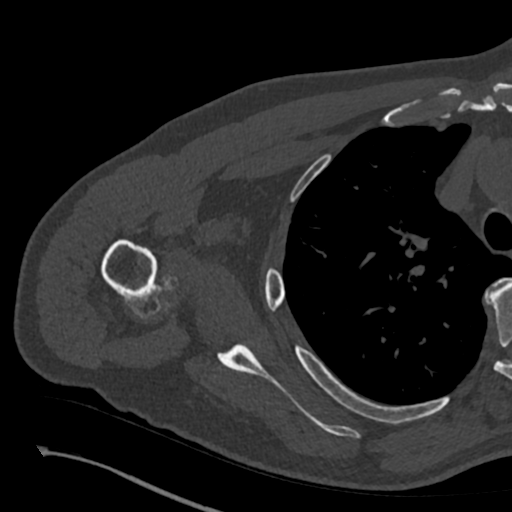
[im 181/362  bone]
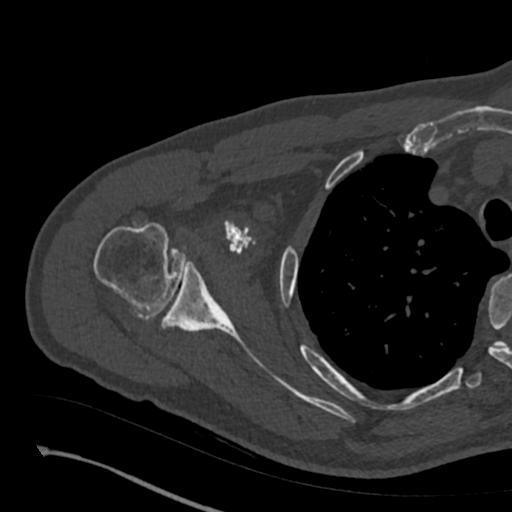
[im 217/362  soft-tissue]
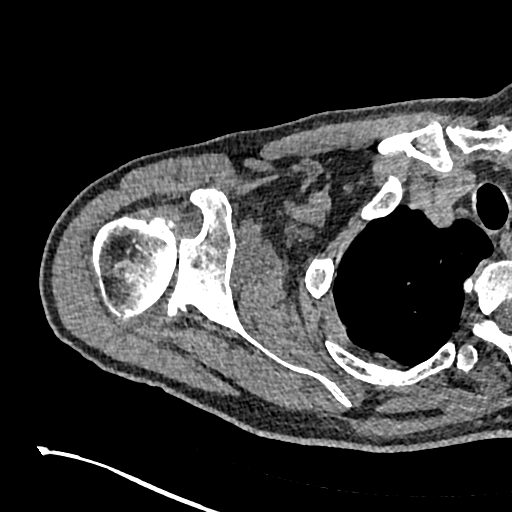
[im 217/362  bone]
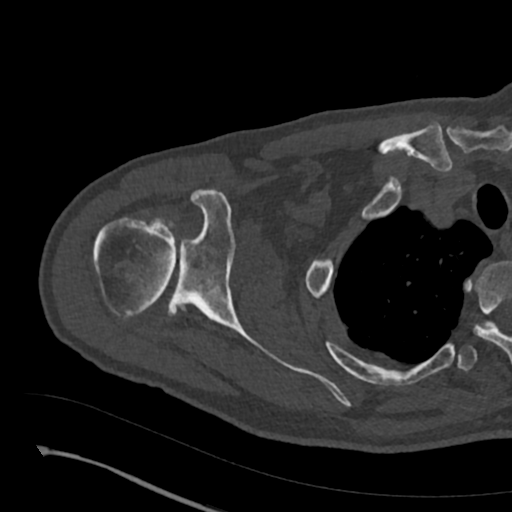
[im 289/362  bone]
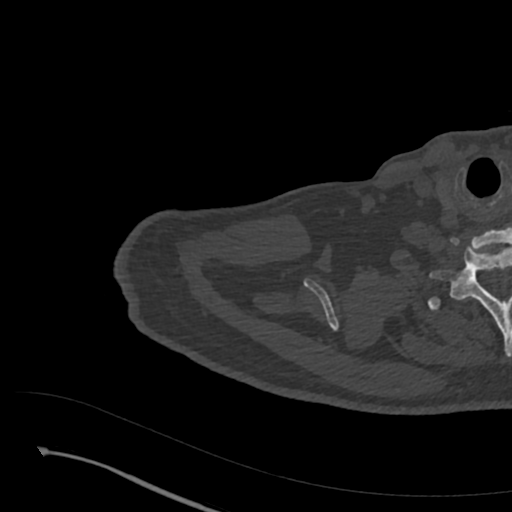
[im 325/362  bone]
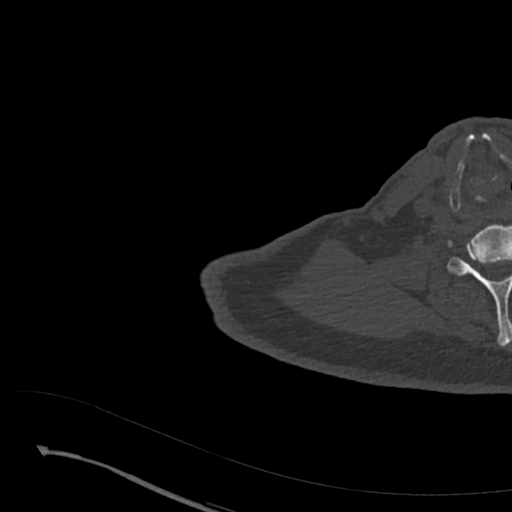

[15 of 33 positions shown; findings below may reference images not displayed]

FINDINGS: Bones/Joint/Cartilage

The patient has severe osteoarthritis of the glenohumeral joint with
prominent marginal osteophyte formation on the inferior aspect of
the humeral head. There is extensive full-thickness cartilage loss
in the glenohumeral joint. There are multiple loose bodies in the
joint including in the bicipital tendon sheath. There is a 2 cm
diameter cluster of calcified loose bodies in the distended
subcoracoid recess of the joint. Moderate glenohumeral joint
effusion. Minimal degenerative changes of the AC joint. Type 1
acromion.

Muscles and Tendons

No atrophy of the muscles of the rotator cuff.

Soft tissues

Negative.
IMPRESSION: Severe osteoarthritis of the glenohumeral joint with multiple loose
bodies in the joint.

## 2020-04-06 ENCOUNTER — Ambulatory Visit: Payer: BC Managed Care – PPO

## 2020-06-12 ENCOUNTER — Ambulatory Visit: Payer: BC Managed Care – PPO | Admitting: Orthopedic Surgery

## 2020-06-12 ENCOUNTER — Ambulatory Visit: Payer: Self-pay

## 2020-06-12 ENCOUNTER — Other Ambulatory Visit: Payer: Self-pay

## 2020-06-12 DIAGNOSIS — M25561 Pain in right knee: Secondary | ICD-10-CM

## 2020-06-12 DIAGNOSIS — M25562 Pain in left knee: Secondary | ICD-10-CM

## 2020-06-18 ENCOUNTER — Encounter: Payer: Self-pay | Admitting: Orthopedic Surgery

## 2020-06-18 NOTE — Progress Notes (Signed)
Office Visit Note   Patient: Greg Munoz           Date of Birth: 18-Sep-1950           MRN: [id number] Visit Date: 06/12/2020 Requested by: Tracey Harries, MD [address] [place],  [place] [address] PCP: Tracey Harries, MD  Subjective: Chief Complaint  Patient presents with  . Left Knee - Pain  . Right Knee - Pain    HPI: Greg Munoz is a 70 year old patient with bilateral knee pain right worse than left.  He was having pretty severe knee pain when he made the appointment but since the appointment the pain has improved some.  Reports swelling but no significant mechanical symptoms.  Radiates down the lateral aspect of his leg more on the right-hand side.  Denies any back pain or groin pain.  Patient states that shoulder is doing well.  Takes occasional over-the-counter medication for the problem.              ROS: All systems reviewed are negative as they relate to the chief complaint within the history of present illness.  Patient denies  fevers or chills.   Assessment & Plan: Visit Diagnoses:  1. Pain in both knees, unspecified chronicity     Plan: Impression is end-stage bilateral knee arthritis right worse than left.  Patient is managing reasonably well at this time with his knees which appear much worse radiographically than he is having symptoms clinically.  Nonetheless I think Greg Munoz will know when it is time to get them replaced.  At this point he does have mild to moderate varus alignment but not to the degree that it would significantly impact implant choice.  I do think that we could get a press-fit knee in both knees if needed.  Otherwise we would need to use a cemented posterior cruciate sacrificing knee which has slightly more constraint.  He will let us know when he wants to consider either injections or knee replacement.  Follow-up as needed..  Follow-Up Instructions: Return if symptoms worsen or fail to improve.   Orders:  Orders Placed This Encounter   Procedures  . XR Knee 1-2 Views Right  . XR KNEE 3 VIEW LEFT   No orders of the defined types were placed in this encounter.     Procedures: No procedures performed   Clinical Data: No additional findings.  Objective: Vital Signs: There were no vitals taken for this visit.  Physical Exam:   Constitutional: Patient appears well-developed HEENT:  Head: Normocephalic Eyes:EOM are normal Neck: Normal range of motion Cardiovascular: Normal rate Pulmonary/chest: Effort normal Neurologic: Patient is alert Skin: Skin is warm Psychiatric: Patient has normal mood and affect    Ortho Exam: Ortho exam demonstrates range of motion of both knees to be from 5 to 10 degrees to about 105 degrees of flexion.  Extensor mechanism is intact.  Varus alignment present.  Pedal pulses palpable.  Collateral and cruciate ligaments are stable.  No groin pain with internal ex rotation of the leg.  Specialty Comments:  No specialty comments available.  Imaging: No results found.   PMFS History: Patient Active Problem List   Diagnosis Date Noted  . Sleep apnea in adult 10/20/2015  . Annual physical exam 11/24/2013  . Persistent insomnia 11/23/2012  . Elevated blood pressure reading 08/23/2011  . Body mass index 27.0-27.9, adult 08/16/2011  . Herpes simplex 03/15/2011  . Disorder of external ear 11/27/2009  . ED (erectile dysfunction)  of organic origin 08/19/2008   Past Medical History:  Diagnosis Date  . Anxiety   . Arthritis    knee  . Depression   . ED (erectile dysfunction)   . HSV infection     History reviewed. No pertinent family history.  Past Surgical History:  Procedure Laterality Date  . COLONOSCOPY  2012, 2018   x 2  . EYE SURGERY Bilateral    lasik  . KNEE SURGERY     arthroscopic knee - pt unsure of which leg  . TOTAL SHOULDER ARTHROPLASTY Right 05/04/2019   Procedure: RIGHT TOTAL SHOULDER ARTHROPLASTY;  Surgeon: Cammy Copa, MD;  Location: [facility] OR;   Service: Orthopedics;  Laterality: Right;  . WISDOM TOOTH EXTRACTION     Social History   Occupational History  . Not on file  Tobacco Use  . Smoking status: Never Smoker  . Smokeless tobacco: Never Used  Vaping Use  . Vaping Use: Never used  Substance and Sexual Activity  . Alcohol use: Yes    Alcohol/week: 7.0 - 10.0 standard drinks    Types: 7 - 10 Standard drinks or equivalent per week  . Drug use: Never  . Sexual activity: Yes

## 2021-07-09 ENCOUNTER — Ambulatory Visit: Payer: BC Managed Care – PPO | Admitting: Surgical

## 2021-08-02 ENCOUNTER — Telehealth: Payer: Self-pay | Admitting: Orthopedic Surgery

## 2021-08-02 MED ORDER — AMOXICILLIN 500 MG PO TABS: ORAL_TABLET | ORAL | 0 refills | Status: DC

## 2021-08-02 NOTE — Telephone Encounter (Signed)
Thank you very much 

## 2021-08-02 NOTE — Addendum Note (Signed)
Addended byLaurann Montana on: 08/02/2021 10:28 AM   Modules accepted: Orders

## 2021-08-02 NOTE — Telephone Encounter (Signed)
Dentist office called and states pt is having a procedure and needs his pre meds.

## 2021-08-02 NOTE — Telephone Encounter (Signed)
IC patient advised antibiotic sent to pharmacy. Advised Dr August Saucer recommended pre-med prior to dental procedure for lifetime. Patient verbalized understanding.

## 2021-12-08 ENCOUNTER — Emergency Department (HOSPITAL_BASED_OUTPATIENT_CLINIC_OR_DEPARTMENT_OTHER): Payer: BC Managed Care – PPO

## 2021-12-08 ENCOUNTER — Emergency Department (HOSPITAL_BASED_OUTPATIENT_CLINIC_OR_DEPARTMENT_OTHER)
Admission: EM | Admit: 2021-12-08 | Discharge: 2021-12-08 | Disposition: A | Discharge status: Home / Self Care | Payer: BC Managed Care – PPO | Attending: Emergency Medicine | Admitting: Emergency Medicine

## 2021-12-08 ENCOUNTER — Encounter (HOSPITAL_BASED_OUTPATIENT_CLINIC_OR_DEPARTMENT_OTHER): Payer: Self-pay

## 2021-12-08 ENCOUNTER — Other Ambulatory Visit: Payer: Self-pay

## 2021-12-08 DIAGNOSIS — Z79899 Other long term (current) drug therapy: Secondary | ICD-10-CM | POA: Insufficient documentation

## 2021-12-08 DIAGNOSIS — R0789 Other chest pain: Secondary | ICD-10-CM | POA: Insufficient documentation

## 2021-12-08 DIAGNOSIS — S30810A Abrasion of lower back and pelvis, initial encounter: Secondary | ICD-10-CM | POA: Insufficient documentation

## 2021-12-08 DIAGNOSIS — S060X9A Concussion with loss of consciousness of unspecified duration, initial encounter: Secondary | ICD-10-CM | POA: Insufficient documentation

## 2021-12-08 DIAGNOSIS — Y908 Blood alcohol level of 240 mg/100 ml or more: Secondary | ICD-10-CM | POA: Diagnosis not present

## 2021-12-08 DIAGNOSIS — W19XXXA Unspecified fall, initial encounter: Secondary | ICD-10-CM | POA: Diagnosis not present

## 2021-12-08 DIAGNOSIS — S01112A Laceration without foreign body of left eyelid and periocular area, initial encounter: Secondary | ICD-10-CM | POA: Insufficient documentation

## 2021-12-08 DIAGNOSIS — S40811A Abrasion of right upper arm, initial encounter: Secondary | ICD-10-CM | POA: Diagnosis not present

## 2021-12-08 DIAGNOSIS — F10129 Alcohol abuse with intoxication, unspecified: Secondary | ICD-10-CM

## 2021-12-08 DIAGNOSIS — S0101XA Laceration without foreign body of scalp, initial encounter: Secondary | ICD-10-CM | POA: Diagnosis not present

## 2021-12-08 DIAGNOSIS — S0181XA Laceration without foreign body of other part of head, initial encounter: Secondary | ICD-10-CM | POA: Diagnosis not present

## 2021-12-08 DIAGNOSIS — Y92009 Unspecified place in unspecified non-institutional (private) residence as the place of occurrence of the external cause: Secondary | ICD-10-CM | POA: Diagnosis not present

## 2021-12-08 DIAGNOSIS — T07XXXA Unspecified multiple injuries, initial encounter: Secondary | ICD-10-CM

## 2021-12-08 DIAGNOSIS — S40812A Abrasion of left upper arm, initial encounter: Secondary | ICD-10-CM | POA: Insufficient documentation

## 2021-12-08 DIAGNOSIS — S0990XA Unspecified injury of head, initial encounter: Secondary | ICD-10-CM | POA: Diagnosis present

## 2021-12-08 LAB — URINALYSIS, ROUTINE W REFLEX MICROSCOPIC
Bilirubin Urine: NEGATIVE
Glucose, UA: NEGATIVE mg/dL
Hgb urine dipstick: NEGATIVE
Ketones, ur: NEGATIVE mg/dL
Leukocytes,Ua: NEGATIVE
Nitrite: NEGATIVE
Protein, ur: NEGATIVE mg/dL
Specific Gravity, Urine: 1.015 (ref 1.005–1.030)
pH: 5 (ref 5.0–8.0)

## 2021-12-08 LAB — BASIC METABOLIC PANEL
Anion gap: 8 (ref 5–15)
BUN: 15 mg/dL (ref 8–23)
CO2: 26 mmol/L (ref 22–32)
Calcium: 9.5 mg/dL (ref 8.9–10.3)
Chloride: 104 mmol/L (ref 98–111)
Creatinine, Ser: 0.89 mg/dL (ref 0.61–1.24)
GFR, Estimated: >60 mL/min (ref >60)
Glucose, Bld: 95 mg/dL (ref 70–99)
Potassium: 5.5 mmol/L — ABNORMAL HIGH (ref 3.5–5.1)
Sodium: 138 mmol/L (ref 135–145)

## 2021-12-08 LAB — RAPID URINE DRUG SCREEN, HOSP PERFORMED
Amphetamines: NOT DETECTED
Barbiturates: NOT DETECTED
Benzodiazepines: NOT DETECTED
Cocaine: NOT DETECTED
Opiates: NOT DETECTED
Tetrahydrocannabinol: NOT DETECTED

## 2021-12-08 LAB — CBC
HCT: 47 % (ref 39.0–52.0)
Hemoglobin: 16.2 g/dL (ref 13.0–17.0)
MCH: 34.8 pg — ABNORMAL HIGH (ref 26.0–34.0)
MCHC: 34.5 g/dL (ref 30.0–36.0)
MCV: 101.1 fL — ABNORMAL HIGH (ref 80.0–100.0)
Platelets: 218 10*3/uL (ref 150–400)
RBC: 4.65 MIL/uL (ref 4.22–5.81)
RDW: 12.4 % (ref 11.5–15.5)
WBC: 11.3 10*3/uL — ABNORMAL HIGH (ref 4.0–10.5)
nRBC: 0 % (ref 0.0–0.2)

## 2021-12-08 LAB — HEPATIC FUNCTION PANEL
ALT: 21 U/L (ref 0–44)
AST: 34 U/L (ref 15–41)
Albumin: 4.7 g/dL (ref 3.5–5.0)
Alkaline Phosphatase: 84 U/L (ref 38–126)
Bilirubin, Direct: 0.1 mg/dL (ref 0.0–0.2)
Indirect Bilirubin: 0.4 mg/dL (ref 0.3–0.9)
Total Bilirubin: 0.5 mg/dL (ref 0.3–1.2)
Total Protein: 8 g/dL (ref 6.5–8.1)

## 2021-12-08 LAB — ETHANOL: Alcohol, Ethyl (B): 241 mg/dL — ABNORMAL HIGH (ref <10)

## 2021-12-08 LAB — AMMONIA: Ammonia: 20 umol/L (ref 9–35)

## 2021-12-08 MED ORDER — ACETAMINOPHEN 325 MG PO TABS: 650.0000 mg | ORAL_TABLET | Freq: Four times a day (QID) | ORAL | Status: DC | PRN

## 2021-12-08 MED ORDER — LIDOCAINE-EPINEPHRINE (PF) 2 %-1:200000 IJ SOLN
10.0000 mL | Freq: Once | INTRAMUSCULAR | Status: AC
  Administered 2021-12-08: 10 mL
  Filled 2021-12-08: qty 20

## 2021-12-08 MED ORDER — SODIUM CHLORIDE 0.9 % IV BOLUS
1000.0000 mL | Freq: Once | INTRAVENOUS | Status: AC
  Administered 2021-12-08: 1000 mL via INTRAVENOUS

## 2021-12-08 MED ORDER — CEPHALEXIN 500 MG PO CAPS: 500.0000 mg | ORAL_CAPSULE | Freq: Four times a day (QID) | ORAL | 0 refills | Status: DC

## 2021-12-08 MED ORDER — CEPHALEXIN 250 MG PO CAPS
500.0000 mg | ORAL_CAPSULE | Freq: Once | ORAL | Status: AC
  Administered 2021-12-08: 500 mg via ORAL
  Filled 2021-12-08: qty 2

## 2021-12-08 NOTE — ED Notes (Signed)
Helped pt to call his wife to come pick him up.  Asked pt to remain seated for safety

## 2021-12-08 NOTE — ED Notes (Signed)
Patient and wife updated on plan of care, waiting for APP for suturing. APP updated that patient and wife have children at home and would like to be discharged. No other needs at this time.

## 2021-12-08 NOTE — ED Notes (Signed)
Multiple wounds cleaned, pt has 3 lacerations on his scalp, one on  his chin, his left eye is swollen/bruised and some bleeding,did not assess inner eye, await MD to assess.

## 2021-12-08 NOTE — ED Notes (Signed)
Wife has went home. [phone]. Call for pick up.

## 2021-12-08 NOTE — ED Provider Notes (Signed)
I provided a substantive portion of the care of this patient.  I personally performed the entirety of the exam for this encounter.  EKG Interpretation  Date/Time:  Saturday December 08 2021 17:17:43 EDT Ventricular Rate:  77 PR Interval:  160 QRS Duration: 119 QT Interval:  428 QTC Calculation: 485 R Axis:   50 Text Interpretation: Sinus rhythm Nonspecific intraventricular conduction delay no sig change from previous Confirmed by Charlesetta Shanks [phone]) on 12/08/2021 6:15:54 PM   Patient reports falling at his office at work.  Presents with multiple abrasions and lacerations to the face and abrasions to the arms and back.  I have reviewed the entire history as outlined in APP note and also confirmed and reviewed this with the patient and his wife at bedside.  Patient is alert.  GCS is 15.  Multiple abrasions and lacerations.  Patient has a small but separable laceration to the lateral aspect of the eyelid on the right.  Does not past the lid margin all margins surrounding the eye are intact.  Patient has a large laceration to the mentum of the chin.  Not actively bleeding.  This penetrates to the deep subcutaneous tissues but I do not visualize any exposure of the mandibular salivary glands.  Extraocular motions are intact.  Pupils are symmetric and responsive.  No hyphema or traumatic appearance of the globes.  Bilateral breath sounds are clear.  Patient denies pain to compression of the chest wall.  No crepitus.  Abdomen soft and nontender.  Patient can follow commands and has symmetric and intact extremity movements and strength.  CT imaging of head, face and C-spine obtained.  No acute findings by radiology interpretation.  Ethanol level is at 241.  I extensively reviewed the patient's alcohol history with himself and his wife at bedside.  They both agree that they do not anticipate significant withdrawal symptoms.  He denies prior history of tremor or confusion or seizure when he stops  drinking.  We reviewed the severe nature of alcohol withdrawal with possible life-threatening complications of which the patient was aware but did not feel he had risk.  At this time patient declines admission for alcohol withdrawal or help with finding programs.  Patient discharged with his wife with strict return precautions reviewed.     Charlesetta Shanks, MD 12/16/21 [phone]

## 2021-12-08 NOTE — ED Provider Notes (Cosign Needed Addendum)
MEDCENTER [facility] EMERGENCY DEPT Provider Note   CSN: [id number] Arrival date & time: 12/08/21  1456     History  Chief Complaint  Patient presents with   Fall   Facial Laceration    Greg Munoz is a 71 y.o. male with Hx of anxiety, depression, erectile dysfunction, insomnia.  Presenting today with multiple lacerations due to recent fall today.  Reports "having too much to drink, I made my wife mad".  Accompanied by his wife.  Was at work and believes he fell while trying to leave work.  Denies feeling lightheaded or dizzy before the fall.  Witnessed by his autistic son, who is described as low functioning, is not present, and is unable to provide recollection or input on the incident.  With multiple lacerations and abrasions of the head and arms.  Also complaining of head pain, mild chest wall soreness, and facial pain/swelling near his left eye.  History limited due to patient's apparent intoxication and mental status.  Not on anticoagulation.  Denies chest pain, shortness of breath, or lightheadedness.    Patient's wife states she tried calling her husband several times, was unable to reach him.  Drove over to his work at [employer] office, found him sitting on the stoop outside the office covered in his blood.  Noted blood on the bottom of the main entry door.  She states there is only 1 step between the entry landing and the ground level, upon which he was found sitting.  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  States he has a long history of alcohol abuse, recently achieved sobriety 6 months ago and lost 30 pounds, and then recently fell back into drinking over the last month.  Patient's wife states he appears more intoxicated than usual.  Denies pt having recent fevers, recent upper respiratory infections, abdominal pain, chest pain, or N/V/D.  The history is provided by the  patient and medical records.  Fall     Home Medications Prior to Admission medications   Medication Sig Start Date End Date Taking? Authorizing Provider  cephALEXin (KEFLEX) 500 MG capsule Take 1 capsule (500 mg total) by mouth 4 (four) times daily. 12/08/21  Yes Cecil Cobbs, PA-C  ALPRAZolam Prudy Feeler) 0.5 MG tablet Take 0.5 mg by mouth daily as needed (anxiety).  06/19/16   [provider]  celecoxib (CELEBREX) 200 MG capsule Take 1 capsule (200 mg total) by mouth 2 (two) times daily. Additionally, take  the morning of surgery as soon as you wake up with a small sip of water. 05/03/19   Magnant, Charles L, PA-C  diclofenac Sodium (VOLTAREN) 1 % GEL Apply 1 application topically 4 (four) times daily as needed (pain/inflammation.).    [provider]  methocarbamol (ROBAXIN) 500 MG tablet Take 1 tablet (500 mg total) by mouth every 8 (eight) hours as needed for muscle spasms. 05/04/19   Magnant, Charles L, PA-C  Omega-3 Fatty Acids (FISH OIL) 1000 MG CAPS Take 1,000 mg by mouth daily with lunch.    [provider]  sildenafil (REVATIO) 20 MG tablet Take 20-100 mg by mouth daily as needed (erectile dysfunction.).  05/17/15   [provider]  Turmeric 500 MG CAPS Take 500 mg by mouth every Monday, Tuesday, Wednesday, Thursday, and Friday.    [provider]      Allergies    Patient has no known  allergies.    Review of Systems   Review of Systems  Constitutional:        Fall  Skin:  Positive for wound.    Physical Exam Updated Vital Signs BP 129/70   Pulse 98   Temp (!) 97.5 F (36.4 C)   Resp 17   SpO2 97%  Physical Exam Vitals and nursing note reviewed.  Constitutional:      General: He is not in acute distress.    Appearance: He is well-developed. He is toxic-appearing. He is not ill-appearing or diaphoretic.     Comments: Appears clinically intoxicated  HENT:     Head: Contusion, left periorbital erythema and laceration  present. No Battle's sign or right periorbital erythema.     Jaw: There is normal jaw occlusion. No trismus, tenderness or pain on movement.     Comments: 3 hematomas of the scalp appreciated on exam with associated lacerations, with mild slow bleeding.  1 hematoma with mild bleeding of the left periorbital tissue, see photo.  1 moderately deep laceration to the chin with mild to moderate bleeding, see photo.    Right Ear: Tympanic membrane, ear canal and external ear normal.     Left Ear: Tympanic membrane, ear canal and external ear normal.     Nose:     Right Nostril: No epistaxis.     Left Nostril: No epistaxis.     Comments: Mild abrasion to the bridge of the nose, with mild deformity, though without evidence of acute or chronic nature on exam.    Mouth/Throat:     Mouth: Mucous membranes are moist.     Pharynx: Oropharynx is clear.     Comments: Without evidence of trauma to the oropharynx.  No blood, laceration, hemorrhage, swelling, tenderness, or edema. Eyes:     General: Lids are normal. Vision grossly intact. Gaze aligned appropriately.        Right eye: No discharge.        Left eye: No discharge.     Extraocular Movements: Extraocular movements intact.     Conjunctiva/sclera: Conjunctivae normal.     Pupils: Pupils are equal, round, and reactive to light.     Visual Fields: Right eye visual fields normal and left eye visual fields normal.     Comments: Bilateral horizontal nystagmus  Neck:     Comments: Very supple on exam, no meningismus or torticollis or midline tenderness Cardiovascular:     Rate and Rhythm: Normal rate and regular rhythm.     Pulses: Normal pulses.     Heart sounds: No murmur heard. Pulmonary:     Effort: Pulmonary effort is normal. No tachypnea, accessory muscle usage, respiratory distress or retractions.     Breath sounds: Normal breath sounds. No stridor or decreased air movement. No decreased breath sounds.     Comments: CTAB, without increased  respiratory effort, able to communicate without difficulty.   Chest:     Chest wall: Tenderness (Mild-minimal) present. No mass, lacerations, deformity, swelling, crepitus or edema.     Comments: Non-TTP, without evidence of deformity, crepitus, edema, swelling, or laceration. Abdominal:     General: There is no distension.     Palpations: Abdomen is soft.     Tenderness: There is no abdominal tenderness.  Musculoskeletal:        General: No swelling.     Cervical back: Neck supple. No rigidity.     Comments: No midline spinal tenderness.  Without lower extremity edema.  Skin:  General: Skin is warm and dry.     Capillary Refill: Capillary refill takes less than 2 seconds.     Comments: Multiple scattered abrasions of the upper extremities, scalp, and face -- see photos.  1 very mild superficial abrasion of the midline lumbar region about 3 cm in diameter.  Neurological:     Mental Status: He is alert and oriented to person, place, and time.     GCS: GCS eye subscore is 4. GCS verbal subscore is 5. GCS motor subscore is 6.     Cranial Nerves: No dysarthria or facial asymmetry.     Sensory: No sensory deficit.     Motor: No weakness, tremor or seizure activity.     Comments: Oriented to self, time, event, and location.  Coordination grossly intact of upper and lower extremities.  No truncal deviation.  Psychiatric:        Mood and Affect: Mood normal.        Behavior: Behavior is cooperative.         ED Results / Procedures / Treatments   Labs (all labs ordered are listed, but only abnormal results are displayed) Labs Reviewed  BASIC METABOLIC PANEL - Abnormal; Notable for the following components:      Result Value   Potassium 5.5 (*)    All other components within normal limits  CBC - Abnormal; Notable for the following components:   WBC 11.3 (*)    MCV 101.1 (*)    MCH 34.8 (*)    All other components within normal limits  ETHANOL - Abnormal; Notable for the  following components:   Alcohol, Ethyl (B) 241 (*)    All other components within normal limits  URINALYSIS, ROUTINE W REFLEX MICROSCOPIC  RAPID URINE DRUG SCREEN, HOSP PERFORMED  AMMONIA  HEPATIC FUNCTION PANEL    EKG EKG Interpretation  Date/Time:  Saturday December 08 2021 17:17:43 EDT Ventricular Rate:  77 PR Interval:  160 QRS Duration: 119 QT Interval:  428 QTC Calculation: 485 R Axis:   50 Text Interpretation: Sinus rhythm Nonspecific intraventricular conduction delay no sig change from previous Confirmed by Charlesetta Shanks [phone]) on 12/08/2021 6:15:54 PM  Radiology CT Maxillofacial Wo Contrast  Result Date: 12/08/2021 CLINICAL DATA:  Facial trauma. EXAM: CT MAXILLOFACIAL WITHOUT CONTRAST TECHNIQUE: Multidetector CT imaging of the maxillofacial structures was performed. Multiplanar CT image reconstructions were also generated. RADIATION DOSE REDUCTION: This exam was performed according to the departmental dose-optimization program which includes automated exposure control, adjustment of the mA and/or kV according to patient size and/or use of iterative reconstruction technique. COMPARISON:  None Available. FINDINGS: Osseous: No fracture or mandibular dislocation. No destructive process. Deviation of nasal septum to the right with unknown acuity. Orbits: Negative. No traumatic or inflammatory finding. Sinuses: Minimal mucosal thickening in the left maxillary sinus. Soft tissues: Left periorbital soft tissue hematoma. IMPRESSION: 1. No evidence of facial fractures. 2. Left periorbital soft tissue hematoma. 3. Deviation of nasal septum to the right within unknown acuity. Electronically Signed   By: Fidela Salisbury M.D.   On: 12/08/2021 17:40   CT Cervical Spine Wo Contrast  Result Date: 12/08/2021 CLINICAL DATA:  Fall. Left facial and scalp injury. EXAM: CT CERVICAL SPINE WITHOUT CONTRAST TECHNIQUE: Multidetector CT imaging of the cervical spine was performed without intravenous  contrast. Multiplanar CT image reconstructions were also generated. RADIATION DOSE REDUCTION: This exam was performed according to the departmental dose-optimization program which includes automated exposure control, adjustment of the mA and/or kV  according to patient size and/or use of iterative reconstruction technique. COMPARISON:  None Available. FINDINGS: Alignment: Slight degenerative retrolisthesis present at C5-6. No other significant listhesis is present. Straightening of the normal cervical lordosis is noted. Skull base and vertebrae: Craniocervical junction is within normal limits. Vertebral body heights are normal. Some motion is present. No acute fractures are present. Soft tissues and spinal canal: No prevertebral fluid or swelling. No visible canal hematoma. Disc levels: Moderate left foraminal stenosis is present at C5-6 and C6-7 due to uncovertebral disease. Upper chest: Lung apices are clear. IMPRESSION: 1. No acute fracture or traumatic subluxation. 2. Moderate left foraminal stenosis at C5-6 and C6-7. Electronically Signed   By: Marin Roberts M.D.   On: 12/08/2021 17:01   CT Head Wo Contrast  Result Date: 12/08/2021 CLINICAL DATA:  Fall at home. Trauma to left eye and chin. EXAM: CT HEAD WITHOUT CONTRAST TECHNIQUE: Contiguous axial images were obtained from the base of the skull through the vertex without intravenous contrast. RADIATION DOSE REDUCTION: This exam was performed according to the departmental dose-optimization program which includes automated exposure control, adjustment of the mA and/or kV according to patient size and/or use of iterative reconstruction technique. COMPARISON:  None Available. FINDINGS: Brain: No acute infarct, hemorrhage, or mass lesion is present. Mild atrophy and white matter hypoattenuation is present. The ventricles are proportionate to the degree of atrophy. No significant extraaxial fluid collection is present. Insert normal brainstem Vascular:  Ectatic left vertebral artery and basilar artery noted. No significant vascular calcifications are present. Skull: Left frontal and parietal scalp hematoma is present. Left periorbital hematoma noted. Soft swelling is present over the left side of the face. No underlying fractures are present. No foreign body is present. Sinuses/Orbits: The paranasal sinuses and mastoid air cells are clear. Globes and orbits are otherwise within normal limits. No postseptal inflammatory changes are present on the left. IMPRESSION: 1. Left frontal and parietal scalp hematoma without underlying fracture. 2. Soft swelling over the left side of the face without underlying fracture. 3. No acute intracranial abnormality. 4. Mild atrophy and white matter disease is mildly advanced for age. This likely reflects the sequela of chronic microvascular ischemia. Electronically Signed   By: Marin Roberts M.D.   On: 12/08/2021 16:58    Procedures .Marland KitchenLaceration Repair  Date/Time: 12/14/2021 8:17 PM  Performed by: Cecil Cobbs, PA-C Authorized by: Cecil Cobbs, PA-C   Consent:    Consent obtained:  Verbal   Consent given by:  Patient   Risks, benefits, and alternatives were discussed: yes     Risks discussed:  Infection, pain, tendon damage, poor wound healing, nerve damage, poor cosmetic result and need for additional repair Universal protocol:    Procedure explained and questions answered to patient or proxy's satisfaction: yes     Patient identity confirmed:  Arm band and verbally with patient Anesthesia:    Anesthesia method:  Local infiltration   Local anesthetic:  Lidocaine 2% WITH epi Laceration details:    Location:  Face   Face location:  Chin   Length (cm):  2.5   Depth (mm):  4 Pre-procedure details:    Preparation:  Patient was prepped and draped in usual sterile fashion and imaging obtained to evaluate for foreign bodies Exploration:    Hemostasis achieved with:  Direct pressure   Imaging  obtained: x-ray     Imaging outcome: foreign body not noted     Wound exploration: wound explored through full range of motion  and entire depth of wound visualized     Wound extent: fascia violated     Wound extent: no foreign bodies/material noted, no tendon damage noted, no underlying fracture noted and no vascular damage noted     Contaminated: yes   Treatment:    Area cleansed with:  Saline   Amount of cleaning:  Standard   Irrigation solution:  Sterile saline   Irrigation method:  Syringe Skin repair:    Repair method:  Sutures   Suture size:  4-0   Suture material:  Prolene and nylon   Suture technique:  Simple interrupted   Number of sutures:  6 Approximation:    Approximation:  Close Repair type:    Repair type:  Simple Post-procedure details:    Dressing:  Sterile dressing and bulky dressing   Procedure completion:  Tolerated well, no immediate complications .Marland KitchenLaceration Repair  Date/Time: 12/14/2021 8:19 PM  Performed by: Cecil Cobbs, PA-C Authorized by: Cecil Cobbs, PA-C   Consent:    Consent obtained:  Verbal   Consent given by:  Patient   Risks discussed:  Infection, need for additional repair, pain, poor cosmetic result, poor wound healing and nerve damage   Alternatives discussed:  No treatment and delayed treatment Universal protocol:    Procedure explained and questions answered to patient or proxy's satisfaction: yes     Patient identity confirmed:  Verbally with patient and arm band Anesthesia:    Anesthesia method:  Local infiltration   Local anesthetic:  Lidocaine 2% WITH epi Laceration details:    Location:  Face   Facial location: Tissue adjacent to left lateral eyelid.   Length (cm):  1   Depth (mm):  2 Pre-procedure details:    Preparation:  Patient was prepped and draped in usual sterile fashion and imaging obtained to evaluate for foreign bodies Exploration:    Hemostasis achieved with:  Direct pressure   Imaging obtained:  x-ray     Imaging outcome: foreign body not noted     Wound exploration: wound explored through full range of motion and entire depth of wound visualized     Wound extent: no foreign bodies/material noted, no tendon damage noted, no underlying fracture noted and no vascular damage noted     Contaminated: yes   Treatment:    Area cleansed with:  Saline   Amount of cleaning:  Standard   Irrigation solution:  Sterile saline   Irrigation method:  Syringe Skin repair:    Repair method:  Sutures   Suture size:  5-0 and 6-0   Suture material:  Nylon   Suture technique:  Simple interrupted   Number of sutures:  1 Approximation:    Approximation:  Close Repair type:    Repair type:  Simple Post-procedure details:    Dressing:  Open (no dressing)   Procedure completion:  Tolerated well, no immediate complications .Marland KitchenLaceration Repair  Date/Time: 12/14/2021 8:21 PM  Performed by: Cecil Cobbs, PA-C Authorized by: Cecil Cobbs, PA-C   Consent:    Consent obtained:  Verbal   Consent given by:  Patient   Risks, benefits, and alternatives were discussed: yes     Risks discussed:  Infection, pain, tendon damage, poor wound healing, nerve damage, poor cosmetic result and need for additional repair   Alternatives discussed:  No treatment and observation Universal protocol:    Procedure explained and questions answered to patient or proxy's satisfaction: yes     Patient identity confirmed:  Arm  band and verbally with patient Anesthesia:    Anesthesia method:  Local infiltration   Local anesthetic:  Lidocaine 2% WITH epi Laceration details:    Location:  Scalp   Scalp location:  Frontal   Length (cm):  1   Depth (mm):  2 Pre-procedure details:    Preparation:  Patient was prepped and draped in usual sterile fashion and imaging obtained to evaluate for foreign bodies Exploration:    Hemostasis achieved with:  Direct pressure   Imaging obtained: x-ray     Imaging outcome:  foreign body not noted     Wound exploration: wound explored through full range of motion and entire depth of wound visualized     Wound extent: no foreign bodies/material noted, no tendon damage noted, no underlying fracture noted and no vascular damage noted     Contaminated: yes   Treatment:    Area cleansed with:  Saline   Amount of cleaning:  Standard   Irrigation solution:  Sterile saline   Irrigation method:  Syringe Skin repair:    Repair method:  Sutures   Suture size:  5-0   Suture material:  Prolene   Suture technique:  Simple interrupted   Number of sutures:  2 Approximation:    Approximation:  Close Repair type:    Repair type:  Simple Post-procedure details:    Dressing:  Sterile dressing, bulky dressing and antibiotic ointment   Procedure completion:  Tolerated well, no immediate complications     Medications Ordered in ED Medications  acetaminophen (TYLENOL) tablet 650 mg (has no administration in time range)  lidocaine-EPINEPHrine (XYLOCAINE W/EPI) 2 %-1:200000 (PF) injection 10 mL (10 mLs Infiltration Given by Other 12/08/21 2100)  sodium chloride 0.9 % bolus 1,000 mL (0 mLs Intravenous Stopped 12/08/21 1847)  cephALEXin (KEFLEX) capsule 500 mg (500 mg Oral Given 12/08/21 2134)    ED Course/ Medical Decision Making/ A&P                           Medical Decision Making Amount and/or Complexity of Data Reviewed Labs: ordered. Radiology: ordered.  Risk OTC drugs. Prescription drug management.   71 y.o. male presents to the ED for concern of Fall and Facial Laceration   This involves an extensive number of treatment options, and is a complaint that carries with it a high risk of complications and morbidity.  The emergent differential diagnosis prior to evaluation includes, but is not limited to: ETOH intoxication, fall, fracture, ICH, laceration, abrasion, contusion, Wernicke's encephalopathy  This is not an exhaustive differential.   Past Medical  History / Co-morbidities / Social History: Hx of anxiety, depression, erectile dysfunction, insomnia Social Determinants of Health include: Elderly  Additional History:  Obtained by pt's wife.  See above  Lab Tests: I ordered, and personally interpreted labs.  The pertinent results include:   UDS: Negative UA unremarkable Ammonia 20 LFTs unremarkable Borderline hyperkalemia 5.5 Ethanol 241 WBC 11.3, elevated MCV/MCH likely related to chronic alcohol use  Imaging Studies: I ordered imaging studies including CT head, neck, and maxillofacial.   I independently visualized and interpreted imaging which showed  Head: 1. Left frontal and parietal scalp hematoma without underlying fracture. 2. Soft swelling over the left side of the face without underlying fracture. 3. No acute intracranial abnormality. 4. Mild atrophy and white matter disease is mildly advanced for age. This likely reflects the sequela of chronic microvascular ischemia. Neck: No acute fracture or subluxation Maxillofacial:  1. No evidence of facial fractures. 2. Left periorbital soft tissue hematoma. 3. Deviation of nasal septum to the right within unknown acuity I agree with the radiologist interpretation.  ED Course: Pt appears clinically intoxicated on exam.  Accompanied by wife.  Hx of anxiety, depression, erectile dysfunction, insomnia.  Presenting today with multiple lacerations due to recent fall today.  Reports "having too much to drink, I made my wife mad".  Was at work and believes he fell while trying to leave work.  Denies feeling lightheaded or dizzy before the fall.  Witnessed by his autistic son, who is described as low functioning, is not present, and is unable to provide recollection or input on the incident.  Presenting with multiple hematomas, lacerations, and bruises to face and arms.  Complaining of head pain, chest wall soreness, and facial pain near his eye.  History limited due to patient's apparent  intoxication and mental status.  Not on anticoagulation.  Per pt's wife, she tried calling her husband several times, was unable to reach him.  Drove over to his work at [employer] office, found him sitting on the stoop outside the office covered in his blood.  Noted blood on the bottom of the main entry door.  She states there is only 1 step between the entry landing and the ground level, upon which he was found sitting.  Patient's wife expresses doubt over that anyone else could have been involved.  She endorses long history of alcohol abuse, recently achieved sobriety 6 months ago and lost 30 pounds, and then recently fell back into drinking over the last month.  Patient's wife states he appears more intoxicated than usual.  Denies pt having recent fevers, recent upper respiratory infections, abdominal pain, chest pain, or N/V/D.   No anion gap, ammonia unremarkable.  Low suspicion for alcoholic ketoacidosis.  Gaze aligned appropriately.  Mild horizontal nystagmus likely due to intoxication, otherwise normal EOMs.  PERRLA.  Grossly intact coordination.  AAOx4.  Nonseptic appearing in NAD.  Without hypotension, tachycardia, or changes in EKG.  No clinical evidence of CHF, shortness of breath, orthopnea, or dyspnea on exertion.  Lungs CTAB satting around 98% on room air.  Temperature not below 35 degrees celsius.  Presentation and history not supportive of Wernicke's Encephalopathy.  No evidence of wrist drop, or peripheral neuropathy.  Low clinical suspicion for Beriberi.  Pt without seizures, shaking, N/V/D, diaphoresis, hypertension, or hyperthermia.  Doubt delirium tremens or acute alcohol withdrawal at this time.  Very mild chest wall tenderness, without bony deformity, shortness of breath, respiratory distress, chest pain, or diminished lung sounds.  No rales or rhonchi.  Low suspicion for pneumothorax, pulmonary edema, or pleural effusion.  No obvious wounds or bruising to hands, knuckles or wrists.  See  photos for wounds of the face and chin.  No evidence of laceration penetration through to oropharynx.  Bleeding is slow and minimal at this time.  ABCs intact.  Several hematomas to head.  Laceration on chin, laceration near left lateral eye, and laceration of frontal scalp appear adequate for suture repair.  2-3 additional small lacerations of the scalp with minimal to no bleeding, and do not appear to need assistance with closure.  Several very small, superficial abrasions to the elbows and arms appreciated as well, without active hemorrhage.  Do not appear to need assistance with closure either.  Fluid bolus and antibiotic provided.  Imaging pending.  Does not meet SIRS or sepsis criteria at this time. CT imaging  without evidence of fracture, ICH, or subluxation.  UDS negative.  Labs not suggestive of metabolic encephalopathy or acute electrolyte abnormality.  Mild possible septal deviation without known acuity.  Clinically correlated on exam without significant tenderness.  Pt also reports this as chronic and occurring prior to today.  Pt appears to have cognitively improved over the course of 2-4 hours and with fluid bolus.  Resting comfortably.  AAOx4 still.  No hx of subarachnoid or subdural hemorrhage.  Patient denies N/V, amnesia, vision changes, and vertigo.  Initial cognitive dysfunction likely due to ETOH intoxication, as seems to continue to improve with time, fluids, and rest.  Plan to clean wounds and repair lacerations.   Lacerations repaired as described above.  Occurred < 8 hours prior to repair which was well tolerated.  Tdap up-to-date.  Due to pt's history, mechanism of injury, and affected tissue, pt with increased risk of infection.  Prescribed short course of antibiotics.  Still no evidence of alcohol withdrawal at this time.  Discussed the likely that patient may have also suffered a concussion.   Upon reevaluation, pt no longer appears clinically intoxicated.  Appears clinically  cognitively capable to make own decisions throughout remainder of encounter.  Would like to monitor remaining symptoms from home with wife.  Shared decision making with pt.  I would ideally recommend pt to receive another bolus of fluids here in the ED however pt has assured he will be adequately monitored at home and feels much improved.  I feel this is reasonable.  Strict return precautions discussed at length.  Discussed suture home care with patient and answered questions.  Instructed pt to follow-up for wound check and suture removal in 4-5 days.  Instructed to rest and keep up with hydration and nutritional intake.  Recommended to decrease/cease alcohol consumption -- additional resources provided for same.  Instructed to follow up with wound care/plastic surgery and/or PCP.  Recommended rest and use of OTC medications like NSAIDs and Tylenol for pain relief.  Advised to not participate in vigorous activities until they are completely asymptomatic for at least 1 week or they are cleared by their primary doctor.  Also recommended following up with Neurology for post-concussive syndrome evaluation should symptoms persist longer than one month.  Pt reports satisfaction with today's encounter.  Patient in NAD and in fair condition at time of discharge.  Disposition: After consideration the patient's encounter today, I do not feel today's workup suggests an emergent condition requiring admission or immediate intervention beyond what has been performed at this time.  Safe for discharge; instructed to return immediately for worsening symptoms, change in symptoms or any other concerns.  I have reviewed the patients home medicines and have made adjustments as needed.  Discussed course of treatment with the patient, whom demonstrated understanding.  Patient in agreement and has no further questions.    I discussed this case with my attending physician Dr. Donnald Garre, who agreed with the proposed treatment course and  cosigned this note including patient's presenting symptoms, physical exam, and planned diagnostics and interventions.  Attending physician stated agreement with plan or made changes to plan which were implemented.     This chart was dictated using voice recognition software.  Despite best efforts to proofread, errors can occur which can change the documentation meaning.         Final Clinical Impression(s) / ED Diagnoses Final diagnoses:  Fall, initial encounter  Facial laceration, initial encounter  Laceration of periorbital area, initial encounter  Abrasion, multiple sites  Alcohol abuse with intoxication (HCC)  Concussion with loss of consciousness, initial encounter    Rx / DC Orders ED Discharge Orders          Ordered    cephALEXin (KEFLEX) 500 MG capsule  4 times daily        12/08/21 2119              Cecil Cobbs, PA-C 12/14/21 2039    Cecil Cobbs, PA-C 12/14/21 2046    Arby Barrette, MD 12/16/21 [phone]

## 2021-12-08 NOTE — ED Notes (Signed)
Unable to void, will try after ivf

## 2021-12-08 NOTE — ED Notes (Signed)
Chin and eye lac cleaned again.

## 2021-12-08 NOTE — ED Triage Notes (Signed)
He states he fell at home, thereby lac. Left eye, mid forehead, left arm, under chin and left forehead areas. He is awake and alert and tells me that he "drank a lot today so my wife is mad at me".

## 2021-12-08 NOTE — Discharge Instructions (Addendum)
An antibiotic by the name of Keflex has been sent to your pharmacy.  Please take 1 capsule every 6 hours for the next week.  Take until the course has been completed.  Always take with food and plenty of water.  Continue to monitor your wounds for signs of infection.  Wound care information has been provided for you to review.  Information for local wound clinic/plastic surgery office has been provided for you.  Please call to schedule a follow-up appointment the next 4-5 days for reevaluation continue medical management.  At this time, the sutures will be likely removed, as these are sutures that do not dissolve and thus are removed in that time period.  Return to the ED for any new or worsening symptoms as discussed.

## 2021-12-08 NOTE — ED Notes (Signed)
Patient transported to CT 
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
## 2022-09-18 ENCOUNTER — Ambulatory Visit: Payer: BC Managed Care – PPO | Admitting: Orthopedic Surgery

## 2022-09-18 DIAGNOSIS — M1712 Unilateral primary osteoarthritis, left knee: Secondary | ICD-10-CM | POA: Diagnosis not present

## 2022-09-18 DIAGNOSIS — M1711 Unilateral primary osteoarthritis, right knee: Secondary | ICD-10-CM | POA: Diagnosis not present

## 2022-09-18 DIAGNOSIS — M170 Bilateral primary osteoarthritis of knee: Secondary | ICD-10-CM

## 2022-09-19 ENCOUNTER — Encounter: Payer: Self-pay | Admitting: Orthopedic Surgery

## 2022-09-19 MED ORDER — BUPIVACAINE HCL 0.25 % IJ SOLN
4.0000 mL | INTRAMUSCULAR | Status: AC | PRN
  Administered 2022-09-18: 4 mL via INTRA_ARTICULAR

## 2022-09-19 MED ORDER — LIDOCAINE HCL 1 % IJ SOLN
5.0000 mL | INTRAMUSCULAR | Status: AC | PRN
  Administered 2022-09-18: 5 mL

## 2022-09-19 MED ORDER — METHYLPREDNISOLONE ACETATE 40 MG/ML IJ SUSP
40.0000 mg | INTRAMUSCULAR | Status: AC | PRN
  Administered 2022-09-18: 40 mg via INTRA_ARTICULAR

## 2022-09-19 NOTE — Progress Notes (Signed)
Office Visit Note   Patient: Greg Munoz           Date of Birth: 06/15/1950           MRN: [id number] Visit Date: 09/18/2022 Requested by: Tracey Harries, MD [address] [place],  [place] [address] PCP: Tracey Harries, MD  Subjective: Chief Complaint  Patient presents with   Left Knee - Pain   Right Knee - Pain    HPI: Greg Munoz is a 72 y.o. male who presents to the office reporting bilateral knee pain.  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  He has had this for several years.  He did do very well in recent past with 30 pound weight loss which really made his knees significantly less symptomatic.  With only slight 5 pound weight gain his knees have become symptomatic again more on the right than the left.  His shoulder is doing well.  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                ROS: All systems reviewed are negative as they relate to the chief complaint within the history of present illness.  Patient denies fevers or chills.  Assessment & Plan: Visit Diagnoses:  1. Primary osteoarthritis of both knees     Plan: Impression is bilateral knee arthritis and varus alignment.  No effusion in either knee today which is a good sign.  Cortisone injections have helped him in the past.  Bilateral cortisone injections performed today and he will continue with nonweightbearing quad strengthening exercises.  He may need knee replacement in the future but it is very encouraging that he had a prolonged pain-free interval with leg strengthening exercises and weight loss.  He will follow-up as needed.  Follow-Up Instructions: No follow-ups on file.   Orders:  No orders of the defined types were placed in this encounter.  No orders of the defined types were placed in this encounter.     Procedures: Large Joint Inj: bilateral knee on 09/18/2022 7:16 AM Indications: diagnostic evaluation,  joint swelling and pain Details: 18 G 1.5 in needle, superolateral approach  Arthrogram: No  Medications (Right): 5 mL lidocaine 1 %; 4 mL bupivacaine 0.25 %; 40 mg methylPREDNISolone acetate 40 MG/ML Medications (Left): 5 mL lidocaine 1 %; 4 mL bupivacaine 0.25 %; 40 mg methylPREDNISolone acetate 40 MG/ML Outcome: tolerated well, no immediate complications Procedure, treatment alternatives, risks and benefits explained, specific risks discussed. Consent was given by the patient. Immediately prior to procedure a time out was called to verify the correct patient, procedure, equipment, support staff and site/side marked as required. Patient was prepped and draped in the usual sterile fashion.       Clinical Data: No additional findings.  Objective: Vital Signs: There were no vitals taken for this visit.  Physical Exam:  Constitutional: Patient appears well-developed HEENT:  Head: Normocephalic Eyes:EOM are normal Neck: Normal range of motion Cardiovascular: Normal rate Pulmonary/chest: Effort normal Neurologic: Patient is alert Skin: Skin is warm Psychiatric: Patient has normal mood and affect  Ortho Exam: Ortho exam demonstrates varus alignment bilateral lower extremities.  Pedal pulses intact.  He has around a 10 to 12 degree flexion contracture in both knees.  Is able to flex the knees past 90.  No groin pain with internal/external Tatian of either leg.  Extensor mechanism functional and intact.  Specialty Comments:  No  specialty comments available.  Imaging: No results found.   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  Past Surgical History:  Procedure Laterality Date   COLONOSCOPY  2012, 2018   x 2   EYE SURGERY Bilateral    lasik   KNEE SURGERY     arthroscopic knee - pt unsure of which leg   TOTAL SHOULDER ARTHROPLASTY Right 05/04/2019   Procedure: RIGHT TOTAL SHOULDER ARTHROPLASTY;  Surgeon: Cammy Copa, MD;  Location: [facility] OR;  Service: Orthopedics;  Laterality: Right;   WISDOM TOOTH EXTRACTION     Social History   Occupational History   Not on file  Tobacco Use   Smoking status: Never   Smokeless tobacco: Never  Vaping Use   Vaping Use: Never used  Substance and Sexual Activity   Alcohol use: Yes    Alcohol/week: 7.0 - 10.0 standard drinks of alcohol    Types: 7 - 10 Standard drinks or equivalent per week   Drug use: Never   Sexual activity: Yes

## 2022-11-20 ENCOUNTER — Emergency Department (HOSPITAL_COMMUNITY)
Admission: EM | Admit: 2022-11-20 | Discharge: 2022-11-20 | Disposition: A | Discharge status: Home / Self Care | Payer: BC Managed Care – PPO | Attending: Emergency Medicine | Admitting: Emergency Medicine

## 2022-11-20 ENCOUNTER — Encounter (HOSPITAL_COMMUNITY): Payer: Self-pay

## 2022-11-20 ENCOUNTER — Emergency Department (HOSPITAL_COMMUNITY): Payer: BC Managed Care – PPO

## 2022-11-20 DIAGNOSIS — S0993XA Unspecified injury of face, initial encounter: Secondary | ICD-10-CM

## 2022-11-20 DIAGNOSIS — W228XXA Striking against or struck by other objects, initial encounter: Secondary | ICD-10-CM | POA: Insufficient documentation

## 2022-11-20 DIAGNOSIS — F10920 Alcohol use, unspecified with intoxication, uncomplicated: Secondary | ICD-10-CM | POA: Diagnosis not present

## 2022-11-20 DIAGNOSIS — Z23 Encounter for immunization: Secondary | ICD-10-CM | POA: Insufficient documentation

## 2022-11-20 DIAGNOSIS — Y908 Blood alcohol level of 240 mg/100 ml or more: Secondary | ICD-10-CM | POA: Diagnosis not present

## 2022-11-20 DIAGNOSIS — S01112A Laceration without foreign body of left eyelid and periocular area, initial encounter: Secondary | ICD-10-CM | POA: Insufficient documentation

## 2022-11-20 DIAGNOSIS — W19XXXA Unspecified fall, initial encounter: Secondary | ICD-10-CM

## 2022-11-20 LAB — CBC WITH DIFFERENTIAL/PLATELET
Abs Immature Granulocytes: 0.02 10*3/uL (ref 0.00–0.07)
Basophils Absolute: 0 10*3/uL (ref 0.0–0.1)
Basophils Relative: 1 %
Eosinophils Absolute: 0.1 10*3/uL (ref 0.0–0.5)
Eosinophils Relative: 1 %
HCT: 40.8 % (ref 39.0–52.0)
Hemoglobin: 13.9 g/dL (ref 13.0–17.0)
Immature Granulocytes: 0 %
Lymphocytes Relative: 47 %
Lymphs Abs: 3 10*3/uL (ref 0.7–4.0)
MCH: 33 pg (ref 26.0–34.0)
MCHC: 34.1 g/dL (ref 30.0–36.0)
MCV: 96.9 fL (ref 80.0–100.0)
Monocytes Absolute: 0.5 10*3/uL (ref 0.1–1.0)
Monocytes Relative: 8 %
Neutro Abs: 2.7 10*3/uL (ref 1.7–7.7)
Neutrophils Relative %: 43 %
Platelets: 260 10*3/uL (ref 150–400)
RBC: 4.21 MIL/uL — ABNORMAL LOW (ref 4.22–5.81)
RDW: 13 % (ref 11.5–15.5)
WBC: 6.4 10*3/uL (ref 4.0–10.5)
nRBC: 0 % (ref 0.0–0.2)

## 2022-11-20 LAB — COMPREHENSIVE METABOLIC PANEL
ALT: 18 U/L (ref 0–44)
AST: 23 U/L (ref 15–41)
Albumin: 3.5 g/dL (ref 3.5–5.0)
Alkaline Phosphatase: 78 U/L (ref 38–126)
Anion gap: 10 (ref 5–15)
BUN: 14 mg/dL (ref 8–23)
CO2: 24 mmol/L (ref 22–32)
Calcium: 8.6 mg/dL — ABNORMAL LOW (ref 8.9–10.3)
Chloride: 105 mmol/L (ref 98–111)
Creatinine, Ser: 0.95 mg/dL (ref 0.61–1.24)
GFR, Estimated: >60 mL/min (ref >60)
Glucose, Bld: 95 mg/dL (ref 70–99)
Potassium: 3.5 mmol/L (ref 3.5–5.1)
Sodium: 139 mmol/L (ref 135–145)
Total Bilirubin: 0.5 mg/dL (ref 0.3–1.2)
Total Protein: 6.8 g/dL (ref 6.5–8.1)

## 2022-11-20 LAB — ETHANOL: Alcohol, Ethyl (B): 285 mg/dL — ABNORMAL HIGH (ref <10)

## 2022-11-20 MED ORDER — LIDOCAINE-EPINEPHRINE-TETRACAINE (LET) TOPICAL GEL
3.0000 mL | Freq: Once | TOPICAL | Status: AC
  Administered 2022-11-20: 3 mL via TOPICAL
  Filled 2022-11-20: qty 3

## 2022-11-20 MED ORDER — TETANUS-DIPHTH-ACELL PERTUSSIS 5-2.5-18.5 LF-MCG/0.5 IM SUSY
0.5000 mL | PREFILLED_SYRINGE | Freq: Once | INTRAMUSCULAR | Status: AC
  Administered 2022-11-20: 0.5 mL via INTRAMUSCULAR
  Filled 2022-11-20: qty 0.5

## 2022-11-20 NOTE — Discharge Instructions (Addendum)
Thank you for allowing [place] to be a part of your care today.  You were evaluated in the ED for injuries related to a fall.  Your workup is overall reassuring.  You do not have any evidence of traumatic injury to your head, neck, or face on CT scans.  You do have a laceration and some swelling to your left eye brow and eye.  Your tetanus shot was updated today.  Your eyebrow was repaired with 2 nondissolving sutures.  You will need to have these removed in 5 to 7 days.  This may be done at a doctors office, urgent care, or in the ED.    You may shower like normal tomorrow morning.  I recommend using bacitracin or Neosporin on the wound to help with wound healing.  I recommend using ice compress to your left eye and face 2-3 times per day for no longer than 20 minutes at a time.  Always use a barrier between the ice and your face such as a cloth or towel.  Return to the ED if you develop sudden worsening of your symptoms or if you have any new concerns.
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
## 2023-07-18 ENCOUNTER — Ambulatory Visit: Payer: Self-pay | Admitting: Orthopedic Surgery

## 2023-07-18 ENCOUNTER — Encounter: Payer: Self-pay | Admitting: Orthopedic Surgery

## 2023-07-18 DIAGNOSIS — M170 Bilateral primary osteoarthritis of knee: Secondary | ICD-10-CM

## 2023-07-18 MED ORDER — LIDOCAINE HCL 1 % IJ SOLN
5.0000 mL | INTRAMUSCULAR | Status: AC | PRN
Start: 2023-07-18 — End: 2023-07-18
  Administered 2023-07-18: 5 mL

## 2023-07-18 MED ORDER — BUPIVACAINE HCL 0.25 % IJ SOLN
4.0000 mL | INTRAMUSCULAR | Status: AC | PRN
Start: 2023-07-18 — End: 2023-07-18
  Administered 2023-07-18: 4 mL via INTRA_ARTICULAR

## 2023-07-18 NOTE — Progress Notes (Signed)
 Office Visit Note   Patient: Greg Munoz           Date of Birth: Dec 20, 1950           MRN: [id number] Visit Date: 07/18/2023 Requested by: Alfredia Ina, MD [address] [place],  [place] [address] PCP: Alfredia Ina, MD  Subjective: Chief Complaint  Patient presents with   Other    Bilat knee cortisone requested    HPI: Greg Munoz is a 73 y.o. male who presents to the office reporting bilateral knee pain.  He has known history of bilateral knee arthritis.  Pain does not wake him from sleep at night.  Really does not describe significant limitation on walking or standing endurance.  Most of his symptoms occur when he is going from the sitting to standing position.  He has had voluntary weight loss recently which has helped his knee pain.  Takes ibuprofen but only 400 mg in the morning..                ROS: All systems reviewed are negative as they relate to the chief complaint within the history of present illness.  Patient denies fevers or chills.  Assessment & Plan: Visit Diagnoses:  1. Primary osteoarthritis of both knees     Plan: Impression is bilateral knee pain.  Bilateral knee cortisone injections performed today.  He has about a 10 degree flexion contracture in both knees with moderate varus alignment.  Overall he is functioning reasonably well with his knees and no indication is present per his symptoms.  We could repeat these injections later this year if needed.  Continue with nonweightbearing quad strengthening exercises such as leg press and leg extension and stationary bike.  Follow-Up Instructions: No follow-ups on file.   Orders:  No orders of the defined types were placed in this encounter.  No orders of the defined types were placed in this encounter.     Procedures: Large Joint Inj: bilateral knee on 07/18/2023 3:48 PM Indications: diagnostic evaluation, joint swelling and pain Details: 18 G 1.5 in needle, superolateral  approach  Arthrogram: No  Medications (Right): 5 mL lidocaine  1 %; 4 mL bupivacaine  0.25 % Medications (Left): 5 mL lidocaine  1 %; 4 mL bupivacaine  0.25 % Outcome: tolerated well, no immediate complications Procedure, treatment alternatives, risks and benefits explained, specific risks discussed. Consent was given by the patient. Immediately prior to procedure a time out was called to verify the correct patient, procedure, equipment, support staff and site/side marked as required. Patient was prepped and draped in the usual sterile fashion.     Kenalog injected  Clinical Data: No additional findings.  Objective: Vital Signs: There were no vitals taken for this visit.  Physical Exam:  Constitutional: Patient appears well-developed HEENT:  Head: Normocephalic Eyes:EOM are normal Neck: Normal range of motion Cardiovascular: Normal rate Pulmonary/chest: Effort normal Neurologic: Patient is alert Skin: Skin is warm Psychiatric: Patient has normal mood and affect  Ortho Exam: Ortho exam demonstrates range of motion of both knees of approximately 10 to 5 degrees.  Has varus alignment.  Pedal pulses palpable.  Quad strength is good bilaterally.  No effusion in either knee.  Extensor mechanism intact and nontender.  Specialty Comments:  No specialty comments available.  Imaging: No results found.   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  Past Surgical History:  Procedure  Laterality Date   COLONOSCOPY  2012, 2018   x 2   EYE SURGERY Bilateral    lasik   KNEE SURGERY     arthroscopic knee - pt unsure of which leg   TOTAL SHOULDER ARTHROPLASTY Right 05/04/2019   Procedure: RIGHT TOTAL SHOULDER ARTHROPLASTY;  Surgeon: Jasmine Mesi, MD;  Location: [facility];  Service: Orthopedics;  Laterality: Right;   WISDOM TOOTH EXTRACTION     Social History   Occupational History   Not on file  Tobacco Use   Smoking status: Never   Smokeless tobacco: Never  Vaping Use   Vaping status: Never Used  Substance and Sexual Activity   Alcohol use: Yes    Alcohol/week: 7.0 - 10.0 standard drinks of alcohol    Types: 7 - 10 Standard drinks or equivalent per week   Drug use: Never   Sexual activity: Yes

## 2023-11-07 ENCOUNTER — Encounter: Payer: Self-pay | Admitting: Orthopedic Surgery

## 2023-11-07 ENCOUNTER — Ambulatory Visit (INDEPENDENT_AMBULATORY_CARE_PROVIDER_SITE_OTHER): Payer: Self-pay | Admitting: Orthopedic Surgery

## 2023-11-07 DIAGNOSIS — M170 Bilateral primary osteoarthritis of knee: Secondary | ICD-10-CM

## 2023-11-07 MED ORDER — LIDOCAINE HCL 1 % IJ SOLN
5.0000 mL | INTRAMUSCULAR | Status: AC | PRN
Start: 2023-11-07 — End: 2023-11-07
  Administered 2023-11-07: 5 mL

## 2023-11-07 MED ORDER — BUPIVACAINE HCL 0.25 % IJ SOLN
4.0000 mL | INTRAMUSCULAR | Status: AC | PRN
Start: 2023-11-07 — End: 2023-11-07
  Administered 2023-11-07: 4 mL via INTRA_ARTICULAR

## 2023-11-07 MED ORDER — TRIAMCINOLONE ACETONIDE 40 MG/ML IJ SUSP
40.0000 mg | INTRAMUSCULAR | Status: AC | PRN
Start: 2023-11-07 — End: 2023-11-07
  Administered 2023-11-07: 40 mg via INTRA_ARTICULAR

## 2023-11-07 NOTE — Progress Notes (Signed)
 Office Visit Note   Patient: Greg Munoz           Date of Birth: May 03, 1950           MRN: [id number] Visit Date: 11/07/2023 Requested by: Pura Lenis, MD [address] [place],  [place] [address] PCP: Pura Lenis, MD  Subjective: Chief Complaint  Patient presents with   Other    Patient here for bilateral knee cortisone injections today. Last injections done 07/18/23    HPI: Greg Munoz is a 73 y.o. male who presents to the office reporting bilateral knee pain.  Pain is not severe but injections have helped him in the past.  He works as a Armed forces logistics/support/administrative officer.  Denies any interval history of injury or trauma..                ROS: All systems reviewed are negative as they relate to the chief complaint within the history of present illness.  Patient denies fevers or chills.  Assessment & Plan: Visit Diagnoses:  1. Primary osteoarthritis of both knees     Plan: Impression is bilateral knee arthritis with varus deformity bilateral knee injections performed today.  Aspirated some fluid out of both knees.  Will see how he does with those injections and we could consider repeating them later this year if needed.  Follow-Up Instructions: No follow-ups on file.   Orders:  No orders of the defined types were placed in this encounter.  No orders of the defined types were placed in this encounter.     Procedures: Large Joint Inj: bilateral knee on 11/07/2023 7:35 PM Indications: diagnostic evaluation, joint swelling and pain Details: 18 G 1.5 in needle, superolateral approach  Arthrogram: No  Medications (Right): 5 mL lidocaine  1 %; 4 mL bupivacaine  0.25 %; 40 mg triamcinolone  acetonide 40 MG/ML Medications (Left): 5 mL lidocaine  1 %; 4 mL bupivacaine  0.25 %; 40 mg triamcinolone  acetonide 40 MG/ML Outcome: tolerated well, no immediate complications Procedure, treatment alternatives, risks and benefits explained, specific risks discussed. Consent was given  by the patient. Immediately prior to procedure a time out was called to verify the correct patient, procedure, equipment, support staff and site/side marked as required. Patient was prepped and draped in the usual sterile fashion.       Clinical Data: No additional findings.  Objective: Vital Signs: There were no vitals taken for this visit.  Physical Exam:  Constitutional: Patient appears well-developed HEENT:  Head: Normocephalic Eyes:EOM are normal Neck: Normal range of motion Cardiovascular: Normal rate Pulmonary/chest: Effort normal Neurologic: Patient is alert Skin: Skin is warm Psychiatric: Patient has normal mood and affect  Ortho Exam: Ortho exam demonstrates flexion contracture in both knees over 10 degrees.  Bends well to about 110 of flexion.  Varus deformity present pedal pulses palpable no groin pain with internal or external rotation of the leg.  Specialty Comments:  No specialty comments available.  Imaging: No results found.   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  Past Surgical History:  Procedure Laterality Date   COLONOSCOPY  2012, 2018   x 2   EYE SURGERY Bilateral    lasik   KNEE SURGERY     arthroscopic knee - pt unsure of which leg   TOTAL SHOULDER ARTHROPLASTY Right 05/04/2019   Procedure: RIGHT TOTAL SHOULDER ARTHROPLASTY;  Surgeon: Addie Cordella Hamilton, MD;  Location: [facility] OR;  Service: Orthopedics;  Laterality: Right;   WISDOM TOOTH  EXTRACTION     Social History   Occupational History   Not on file  Tobacco Use   Smoking status: Never   Smokeless tobacco: Never  Vaping Use   Vaping status: Never Used  Substance and Sexual Activity   Alcohol use: Yes    Alcohol/week: 7.0 - 10.0 standard drinks of alcohol    Types: 7 - 10 Standard drinks or equivalent per week   Drug use: Never   Sexual activity: Yes

## 2024-01-19 ENCOUNTER — Encounter: Payer: Self-pay | Admitting: Radiology
